# Patient Record
Sex: Female | Born: 1975 | Race: White | Hispanic: No | Marital: Married | Smoking: Never smoker
Health system: Southern US, Community
[De-identification: ages and names within clinical notes are randomized; demographics above are authoritative.]

## PROBLEM LIST (undated history)

## (undated) ENCOUNTER — Ambulatory Visit: Admission: EM | Source: Home / Self Care

## (undated) DIAGNOSIS — IMO0002 Reserved for concepts with insufficient information to code with codable children: Secondary | ICD-10-CM

## (undated) DIAGNOSIS — Z8619 Personal history of other infectious and parasitic diseases: Secondary | ICD-10-CM

## (undated) DIAGNOSIS — O99820 Streptococcus B carrier state complicating pregnancy: Secondary | ICD-10-CM

## (undated) DIAGNOSIS — B379 Candidiasis, unspecified: Secondary | ICD-10-CM

## (undated) HISTORY — PX: WISDOM TOOTH EXTRACTION: SHX21

## (undated) HISTORY — DX: Streptococcus B carrier state complicating pregnancy: O99.820

## (undated) HISTORY — DX: Personal history of other infectious and parasitic diseases: Z86.19

## (undated) HISTORY — DX: Reserved for concepts with insufficient information to code with codable children: IMO0002

## (undated) HISTORY — DX: Candidiasis, unspecified: B37.9

---

## 2010-07-04 DIAGNOSIS — IMO0002 Reserved for concepts with insufficient information to code with codable children: Secondary | ICD-10-CM

## 2010-07-04 DIAGNOSIS — R87619 Unspecified abnormal cytological findings in specimens from cervix uteri: Secondary | ICD-10-CM

## 2010-07-04 HISTORY — DX: Reserved for concepts with insufficient information to code with codable children: IMO0002

## 2010-07-04 HISTORY — DX: Unspecified abnormal cytological findings in specimens from cervix uteri: R87.619

## 2011-08-05 NOTE — L&D Delivery Note (Signed)
Delivery Note  Labor progressed quickly, FHR stable, passive descent and intermittent pushing    At 5:05 AM a viable female was delivered via Vaginal, Spontaneous Delivery (Presentation: Right Occiput Anterior).  Loose nuchal cord, delivered through, easy delivery of shoulders, APGAR: 8, 9; weight 8 lb 3.9 oz (3740 g).   Placenta status: Intact, Spontaneous.  Cord: 3 vessels with the following complications: None.  Cord pH: n/a  Anesthesia: Epidural, local  Episiotomy: None Lacerations: 1st degree Suture Repair: 4-0 monocryl  Est. Blood Loss (mL): 300  Mom to postpartum.  Baby to nursery-stable. Infant remains in room skin-skin Pt plans to BF Plans IP circumcision  Desires micronor for contraception Dr Normand Sloop notified   Malissa Hippo 04/24/2012, 7:22 AM

## 2011-10-09 ENCOUNTER — Encounter (INDEPENDENT_AMBULATORY_CARE_PROVIDER_SITE_OTHER): Payer: 59

## 2011-10-09 DIAGNOSIS — Z3201 Encounter for pregnancy test, result positive: Secondary | ICD-10-CM

## 2011-10-09 LAB — OB RESULTS CONSOLE HIV ANTIBODY (ROUTINE TESTING): HIV: NONREACTIVE

## 2011-10-09 LAB — OB RESULTS CONSOLE HEPATITIS B SURFACE ANTIGEN: Hepatitis B Surface Ag: NEGATIVE

## 2011-10-09 LAB — OB RESULTS CONSOLE RUBELLA ANTIBODY, IGM: Rubella: IMMUNE

## 2011-10-09 LAB — OB RESULTS CONSOLE ANTIBODY SCREEN: Antibody Screen: NEGATIVE

## 2011-10-17 ENCOUNTER — Other Ambulatory Visit: Payer: 59

## 2011-10-17 ENCOUNTER — Other Ambulatory Visit (INDEPENDENT_AMBULATORY_CARE_PROVIDER_SITE_OTHER): Payer: 59

## 2011-10-17 ENCOUNTER — Encounter (INDEPENDENT_AMBULATORY_CARE_PROVIDER_SITE_OTHER): Payer: 59

## 2011-10-17 DIAGNOSIS — Z1389 Encounter for screening for other disorder: Secondary | ICD-10-CM

## 2011-10-17 DIAGNOSIS — Z36 Encounter for antenatal screening of mother: Secondary | ICD-10-CM

## 2011-10-28 ENCOUNTER — Encounter (INDEPENDENT_AMBULATORY_CARE_PROVIDER_SITE_OTHER): Payer: 59 | Admitting: Obstetrics and Gynecology

## 2011-10-28 DIAGNOSIS — Z3480 Encounter for supervision of other normal pregnancy, unspecified trimester: Secondary | ICD-10-CM

## 2011-11-10 ENCOUNTER — Other Ambulatory Visit: Payer: Self-pay | Admitting: Obstetrics and Gynecology

## 2011-11-10 ENCOUNTER — Other Ambulatory Visit (INDEPENDENT_AMBULATORY_CARE_PROVIDER_SITE_OTHER): Payer: 59

## 2011-11-10 DIAGNOSIS — Z331 Pregnant state, incidental: Secondary | ICD-10-CM

## 2011-11-11 ENCOUNTER — Other Ambulatory Visit: Payer: 59

## 2011-11-11 LAB — ALPHA FETOPROTEIN, MATERNAL
Open Spina bifida: NEGATIVE
Osb Risk: <1:27300 {titer}

## 2011-11-24 ENCOUNTER — Other Ambulatory Visit: Payer: Self-pay

## 2011-11-24 DIAGNOSIS — Z331 Pregnant state, incidental: Secondary | ICD-10-CM

## 2011-11-25 ENCOUNTER — Other Ambulatory Visit: Payer: 59

## 2011-11-25 ENCOUNTER — Ambulatory Visit (INDEPENDENT_AMBULATORY_CARE_PROVIDER_SITE_OTHER): Payer: 59

## 2011-11-25 ENCOUNTER — Encounter: Payer: Self-pay | Admitting: Obstetrics and Gynecology

## 2011-11-25 ENCOUNTER — Ambulatory Visit (INDEPENDENT_AMBULATORY_CARE_PROVIDER_SITE_OTHER): Payer: 59 | Admitting: Obstetrics and Gynecology

## 2011-11-25 ENCOUNTER — Other Ambulatory Visit: Payer: Self-pay | Admitting: Obstetrics and Gynecology

## 2011-11-25 VITALS — BP 102/58 | Ht 69.0 in | Wt 176.0 lb

## 2011-11-25 DIAGNOSIS — Z331 Pregnant state, incidental: Secondary | ICD-10-CM

## 2011-11-25 LAB — US OB COMP + 14 WK

## 2011-11-25 NOTE — Patient Instructions (Signed)
Preterm Labor Preterm labor is when labor starts at less than 37 weeks of pregnancy. The normal length of a pregnancy is 39 to 41 weeks. CAUSES Often, there is no identifiable underlying cause as to why a woman goes into preterm labor. However, one of the most common known causes of preterm labor is infection. Infections of the uterus, cervix, vagina, amniotic sac, bladder, kidney, or even the lungs (pneumonia) can cause labor to start. Other causes of preterm labor include:  Urogenital infections, such as yeast infections and bacterial vaginosis.   Uterine abnormalities (uterine shape, uterine septum, fibroids, bleeding from the placenta).   A cervix that has been operated on and opens prematurely.   Malformations in the baby.   Multiple gestations (twins, triplets, and so on).   Breakage of the amniotic sac.  Additional risk factors for preterm labor include:  Previous history of preterm labor.   Premature rupture of membranes (PROM).   A placenta that covers the opening of the cervix (placenta previa).   A placenta that separates from the uterus (placenta abruption).   A cervix that is too weak to hold the baby in the uterus (incompetence cervix).   Having too much fluid in the amniotic sac (polyhydramnios).   Taking illegal drugs or smoking while pregnant.   Not gaining enough weight while pregnant.   Women younger than 18 and older than 35 years old.   Low socioeconomic status.   African-American ethnicity.  SYMPTOMS Signs and symptoms of preterm labor include:  Menstrual-like cramps.   Contractions that are 30 to 70 seconds apart, become very regular, closer together, and are more intense and painful.   Contractions that start on the top of the uterus and spread down to the lower abdomen and back.   A sense of increased pelvic pressure or back pain.   A watery or bloody discharge that comes from the vagina.  DIAGNOSIS  A diagnosis can be confirmed by:  A  vaginal exam.   An ultrasound of the cervix.   Sampling (swabbing) cervico-vaginal secretions. These samples can be tested for the presence of fetal fibronectin. This is a protein found in cervical discharge which is associated with preterm labor.   Fetal monitoring.  TREATMENT  Depending on the length of the pregnancy and other circumstances, a caregiver may suggest bed rest. If necessary, there are medicines that can be given to stop contractions and to quicken fetal lung maturity. If labor happens before 34 weeks of pregnancy, a prolonged hospital stay may be recommended. Treatment depends on the condition of both the mother and baby. PREVENTION There are some things a mother can do to lower the risk of preterm labor in future pregnancies. A woman can:   Stop smoking.   Maintain healthy weight gain and avoid chemicals and drugs that are not necessary.   Be watchful for any type of infection.   Inform her caregiver if she has a known history of preterm labor.  Document Released: 10/11/2003 Document Revised: 07/10/2011 Document Reviewed: 11/15/2010 Exit[employer] Patient Information 2012 [employer].  ABCs of Pregnancy A Antepartum care is very important. Be sure you see your doctor and get prenatal care as soon as you think you are pregnant. At this time, you will be tested for infection, genetic abnormalities and potential problems with you and the pregnancy. This is the time to discuss diet, exercise, work, medications, labor, pain medication during labor and the possibility of a cesarean delivery. Ask any questions that may concern   you. It is important to see your doctor regularly throughout your pregnancy. Avoid exposure to toxic substances and chemicals - such as cleaning solvents, lead and mercury, some insecticides, and paint. Pregnant women should avoid exposure to paint fumes, and fumes that cause you to feel ill, dizzy or faint. When possible, it is a good idea to have a  pre-pregnancy consultation with your caregiver to begin some important recommendations your caregiver suggests such as, taking folic acid, exercising, quitting smoking, avoiding alcoholic beverages, etc. B Breastfeeding is the healthiest choice for both you and your baby. It has many nutritional benefits for the baby and health benefits for the mother. It also creates a very tight and loving bond between the baby and mother. Talk to your doctor, your family and friends, and your employer about how you choose to feed your baby and how they can support you in your decision. Not all birth defects can be prevented, but a woman can take actions that may increase her chance of having a healthy baby. Many birth defects happen very early in pregnancy, sometimes before a woman even knows she is pregnant. Birth defects or abnormalities of any child in your or the father's family should be discussed with your caregiver. Get a good support bra as your breast size changes. Wear it especially when you exercise and when nursing.  C Celebrate the news of your pregnancy with the your spouse/father and family. Childbirth classes are helpful to take for you and the spouse/father because it helps to understand what happens during the pregnancy, labor and delivery. Cesarean delivery should be discussed with your doctor so you are prepared for that possibility. The pros and cons of circumcision if it is a boy, should be discussed with your pediatrician. Cigarette smoking during pregnancy can result in low birth weight babies. It has been associated with infertility, miscarriages, tubal pregnancies, infant death (mortality) and poor health (morbidity) in childhood. Additionally, cigarette smoking may cause long-term learning disabilities. If you smoke, you should try to quit before getting pregnant and not smoke during the pregnancy. Secondary smoke may also harm a mother and her developing baby. It is a good idea to ask people to  stop smoking around you during your pregnancy and after the baby is born. Extra calcium is necessary when you are pregnant and is found in your prenatal vitamin, in dairy products, green leafy vegetables and in calcium supplements. D A healthy diet according to your current weight and height, along with vitamins and mineral supplements should be discussed with your caregiver. Domestic abuse or violence should be made known to your doctor right away to get the situation corrected. Drink more water when you exercise to keep hydrated. Discomfort of your back and legs usually develops and progresses from the middle of the second trimester through to delivery of the baby. This is because of the enlarging baby and uterus, which may also affect your balance. Do not take illegal drugs. Illegal drugs can seriously harm the baby and you. Drink extra fluids (water is best) throughout pregnancy to help your body keep up with the increases in your blood volume. Drink at least 6 to 8 glasses of water, fruit juice, or milk each day. A good way to know you are drinking enough fluid is when your urine looks almost like clear water or is very light yellow.  E Eat healthy to get the nutrients you and your unborn baby need. Your meals should include the five basic food groups.   Exercise (30 minutes of light to moderate exercise a day) is important and encouraged during pregnancy, if there are no medical problems or problems with the pregnancy. Exercise that causes discomfort or dizziness should be stopped and reported to your caregiver. Emotions during pregnancy can change from being ecstatic to depression and should be understood by you, your partner and your family. F Fetal screening with ultrasound, amniocentesis and monitoring during pregnancy and labor is common and sometimes necessary. Take 400 micrograms of folic acid daily both before, when possible, and during the first few months of pregnancy to reduce the risk of birth  defects of the brain and spine. All women who could possibly become pregnant should take a vitamin with folic acid, every day. It is also important to eat a healthy diet with fortified foods (enriched grain products, including cereals, rice, breads, and pastas) and foods with natural sources of folate (orange juice, green leafy vegetables, beans, peanuts, broccoli, asparagus, peas, and lentils). The father should be involved with all aspects of the pregnancy including, the prenatal care, childbirth classes, labor, delivery, and postpartum time. Fathers may also have emotional concerns about being a father, financial needs, and raising a family. G Genetic testing should be done appropriately. It is important to know your family and the father's history. If there have been problems with pregnancies or birth defects in your family, report these to your doctor. Also, genetic counselors can talk with you about the information you might need in making decisions about having a family. You can call a major medical center in your area for help in finding a board-certified genetic counselor. Genetic testing and counseling should be done before pregnancy when possible, especially if there is a history of problems in the mother's or father's family. Certain ethnic backgrounds are more at risk for genetic defects. H Get familiar with the hospital where you will be having your baby. Get to know how long it takes to get there, the labor and delivery area, and the hospital procedures. Be sure your medical insurance is accepted there. Get your home ready for the baby including, clothes, the baby's room (when possible), furniture and car seat. Hand washing is important throughout the day, especially after handling raw meat and poultry, changing the baby's diaper or using the bathroom. This can help prevent the spread of many bacteria and viruses that cause infection. Your hair may become dry and thinner, but will return to normal  a few weeks after the baby is born. Heartburn is a common problem that can be treated by taking antacids recommended by your caregiver, eating smaller meals 5 or 6 times a day, not drinking liquids when eating, drinking between meals and raising the head of your bed 2 to 3 inches. I Insurance to cover you, the baby, doctor and hospital should be reviewed so that you will be prepared to pay any costs not covered by your insurance plan. If you do not have medical insurance, there are usually clinics and services available for you in your community. Take 30 milligrams of iron during your pregnancy as prescribed by your doctor to reduce the risk of low red blood cells (anemia) later in pregnancy. All women of childbearing age should eat a diet rich in iron. J There should be a joint effort for the mother, father and any other children to adapt to the pregnancy financially, emotionally, and psychologically during the pregnancy. Join a support group for moms-to-be. Or, join a class on parenting or childbirth.   Have the family participate when possible. K Know your limits. Let your caregiver know if you experience any of the following:   Pain of any kind.   Strong cramps.   You develop a lot of weight in a short period of time (5 pounds in 3 to 5 days).   Vaginal bleeding, leaking of amniotic fluid.   Headache, vision problems.   Dizziness, fainting, shortness of breath.   Chest pain.   Fever of 102 F (38.9 C) or higher.   Gush of clear fluid from your vagina.   Painful urination.   Domestic violence.   Irregular heartbeat (palpitations).   Rapid beating of the heart (tachycardia).   Constant feeling sick to your stomach (nauseous) and vomiting.   Trouble walking, fluid retention (edema).   Muscle weakness.   If your baby has decreased activity.   Persistent diarrhea.   Abnormal vaginal discharge.   Uterine contractions at 20-minute intervals.   Back pain that travels down  your leg.  L Learn and practice that what you eat and drink should be in moderation and healthy for you and your baby. Legal drugs such as alcohol and caffeine are important issues for pregnant women. There is no safe amount of alcohol a woman can drink while pregnant. Fetal alcohol syndrome, a disorder characterized by growth retardation, facial abnormalities, and central nervous system dysfunction, is caused by a woman's use of alcohol during pregnancy. Caffeine, found in tea, coffee, soft drinks and chocolate, should also be limited. Be sure to read labels when trying to cut down on caffeine during pregnancy. More than 200 foods, beverages, and over-the-counter medications contain caffeine and have a high salt content! There are coffees and teas that do not contain caffeine. M Medical conditions such as diabetes, epilepsy, and high blood pressure should be treated and kept under control before pregnancy when possible, but especially during pregnancy. Ask your caregiver about any medications that may need to be changed or adjusted during pregnancy. If you are currently taking any medications, ask your caregiver if it is safe to take them while you are pregnant or before getting pregnant when possible. Also, be sure to discuss any herbs or vitamins you are taking. They are medicines, too! Discuss with your doctor all medications, prescribed and over-the-counter, that you are taking. During your prenatal visit, discuss the medications your doctor may give you during labor and delivery. N Never be afraid to ask your doctor or caregiver questions about your health, the progress of the pregnancy, family problems, stressful situations, and recommendation for a pediatrician, if you do not have one. It is better to take all precautions and discuss any questions or concerns you may have during your office visits. It is a good idea to write down your questions before you visit the doctor. O Over-the-counter cough  and cold remedies may contain alcohol or other ingredients that should be avoided during pregnancy. Ask your caregiver about prescription, herbs or over-the-counter medications that you are taking or may consider taking while pregnant.  P Physical activity during pregnancy can benefit both you and your baby by lessening discomfort and fatigue, providing a sense of well-being, and increasing the likelihood of early recovery after delivery. Light to moderate exercise during pregnancy strengthens the belly (abdominal) and back muscles. This helps improve posture. Practicing yoga, walking, swimming, and cycling on a stationary bicycle are usually safe exercises for pregnant women. Avoid scuba diving, exercise at high altitudes (over 3000 feet), skiing, horseback riding, contact   sports, etc. Always check with your doctor before beginning any kind of exercise, especially during pregnancy and especially if you did not exercise before getting pregnant. Q Queasiness, stomach upset and morning sickness are common during pregnancy. Eating a couple of crackers or dry toast before getting out of bed. Foods that you normally love may make you feel sick to your stomach. You may need to substitute other nutritious foods. Eating 5 or 6 small meals a day instead of 3 large ones may make you feel better. Do not drink with your meals, drink between meals. Questions that you have should be written down and asked during your prenatal visits. R Read about and make plans to baby-proof your home. There are important tips for making your home a safer environment for your baby. Review the tips and make your home safer for you and your baby. Read food labels regarding calories, salt and fat content in the food. S Saunas, hot tubs, and steam rooms should be avoided while you are pregnant. Excessive high heat may be harmful during your pregnancy. Your caregiver will screen and examine you for sexually transmitted diseases and genetic  disorders during your prenatal visits. Learn the signs of labor. Sexual relations while pregnant is safe unless there is a medical or pregnancy problem and your caregiver advises against it. T Traveling long distances should be avoided especially in the third trimester of your pregnancy. If you do have to travel out of state, be sure to take a copy of your medical records and medical insurance plan with you. You should not travel long distances without seeing your doctor first. Most airlines will not allow you to travel after 36 weeks of pregnancy. Toxoplasmosis is an infection caused by a parasite that can seriously harm an unborn baby. Avoid eating undercooked meat and handling cat litter. Be sure to wear gloves when gardening. Tingling of the hands and fingers is not unusual and is due to fluid retention. This will go away after the baby is born. U Womb (uterus) size increases during the first trimester. Your kidneys will begin to function more efficiently. This may cause you to feel the need to urinate more often. You may also leak urine when sneezing, coughing or laughing. This is due to the growing uterus pressing against your bladder, which lies directly in front of and slightly under the uterus during the first few months of pregnancy. If you experience burning along with frequency of urination or bloody urine, be sure to tell your doctor. The size of your uterus in the third trimester may cause a problem with your balance. It is advisable to maintain good posture and avoid wearing high heels during this time. An ultrasound of your baby may be necessary during your pregnancy and is safe for you and your baby. V Vaccinations are an important concern for pregnant women. Get needed vaccines before pregnancy. Center for Disease Control (www.cdc.gov) has clear guidelines for the use of vaccines during pregnancy. Review the list, be sure to discuss it with your doctor. Prenatal vitamins are helpful and  healthy for you and the baby. Do not take extra vitamins except what is recommended. Taking too much of certain vitamins can cause overdose problems. Continuous vomiting should be reported to your caregiver. Varicose veins may appear especially if there is a family history of varicose veins. They should subside after the delivery of the baby. Support hose helps if there is leg discomfort. W Being overweight or underweight during pregnancy may   cause problems. Try to get within 15 pounds of your ideal weight before pregnancy. Remember, pregnancy is not a time to be dieting! Do not stop eating or start skipping meals as your weight increases. Both you and your baby need the calories and nutrition you receive from a healthy diet. Be sure to consult with your doctor about your diet. There is a formula and diet plan available depending on whether you are overweight or underweight. Your caregiver or nutritionist can help and advise you if necessary. X Avoid X-rays. If you must have dental work or diagnostic tests, tell your dentist or physician that you are pregnant so that extra care can be taken. X-rays should only be taken when the risks of not taking them outweigh the risk of taking them. If needed, only the minimum amount of radiation should be used. When X-rays are necessary, protective lead shields should be used to cover areas of the body that are not being X-rayed. Y Your baby loves you. Breastfeeding your baby creates a loving and very close bond between the two of you. Give your baby a healthy environment to live in while you are pregnant. Infants and children require constant care and guidance. Their health and safety should be carefully watched at all times. After the baby is born, rest or take a nap when the baby is sleeping. Z Get your ZZZs. Be sure to get plenty of rest. Resting on your side as often as possible, especially on your left side is advised. It provides the best circulation to your baby  and helps reduce swelling. Try taking a nap for 30 to 45 minutes in the afternoon when possible. After the baby is born rest or take a nap when the baby is sleeping. Try elevating your feet for that amount of time when possible. It helps the circulation in your legs and helps reduce swelling.  Most information courtesy of the CDC. Document Released: 07/21/2005 Document Revised: 07/10/2011 Document Reviewed: 04/04/2009 [employer] Patient Information 2012 [employer]. 

## 2011-11-25 NOTE — Progress Notes (Signed)
Jonita Albee [redacted]w[redacted]d   Doing well No ctx, VB or LOF AFP normal Anat [place] today EDC=9/13  LMP EDC=9/22 - sure Previous scan for 1st trim screen - EDC=9/16 EFW=11oz (>97%) Cx=3.44cm Anterior grade 0 placenta Normal fluid All anatomy seen, no abnoralities   Plan:

## 2011-12-23 ENCOUNTER — Ambulatory Visit (INDEPENDENT_AMBULATORY_CARE_PROVIDER_SITE_OTHER): Payer: 59 | Admitting: Obstetrics and Gynecology

## 2011-12-23 ENCOUNTER — Encounter: Payer: Self-pay | Admitting: Obstetrics and Gynecology

## 2011-12-23 VITALS — BP 90/50 | Wt 178.0 lb

## 2011-12-23 DIAGNOSIS — Z3490 Encounter for supervision of normal pregnancy, unspecified, unspecified trimester: Secondary | ICD-10-CM

## 2011-12-23 DIAGNOSIS — Z3480 Encounter for supervision of other normal pregnancy, unspecified trimester: Secondary | ICD-10-CM

## 2011-12-23 DIAGNOSIS — R21 Rash and other nonspecific skin eruption: Secondary | ICD-10-CM

## 2011-12-23 MED ORDER — ONDANSETRON HCL 4 MG PO TABS: 4.0000 mg | ORAL_TABLET | Freq: Three times a day (TID) | ORAL | Status: AC | PRN

## 2011-12-23 NOTE — Progress Notes (Signed)
Doing well, except for sporadic rash on abdomen and arms--red bumps, itchy.  Declines meds, may want derm referral if persists. Had virus this weekend, still very nauseated, but vomiting and diarrhea resolved. Discussed EDC:  Normal LMP dating 04/25/12.                               1st trimester [place] [facility] 04/19/12.                                Anatomy scan [place] [facility] 04/16/12 Patient very concerned about CNM at last visit commenting about possible change in EDC--doesn't want that to increase her risk of induction. Advised patient I would consult with MD regarding dating criteria for final word. I would anticipate leaving EDC 04/25/12, since 1st trimester [place] was only 6 days different from normal LMP EDC and hx of 28 day cycles. Rx Zofran 4 mg ODT to pharmacy.  If rash persists, will draw bile acids at [place]. Derm referral if patient wishes to pursue--I did give her names for dermatologists and reviewed peds in community at her request.  Glucola, hgb, RPR, and NV--bile acids/CMP if rash persists.

## 2011-12-23 NOTE — Progress Notes (Signed)
C/o of hyperemesis Pt wants rx for Zofran Wants to discuss dating for EDD

## 2012-01-20 ENCOUNTER — Ambulatory Visit (INDEPENDENT_AMBULATORY_CARE_PROVIDER_SITE_OTHER): Payer: 59 | Admitting: Obstetrics and Gynecology

## 2012-01-20 ENCOUNTER — Encounter: Payer: 59 | Admitting: Obstetrics and Gynecology

## 2012-01-20 ENCOUNTER — Other Ambulatory Visit: Payer: 59

## 2012-01-20 ENCOUNTER — Ambulatory Visit: Payer: 59

## 2012-01-20 ENCOUNTER — Encounter: Payer: Self-pay | Admitting: Obstetrics and Gynecology

## 2012-01-20 VITALS — BP 100/64 | Wt 183.0 lb

## 2012-01-20 DIAGNOSIS — Z3490 Encounter for supervision of normal pregnancy, unspecified, unspecified trimester: Secondary | ICD-10-CM

## 2012-01-20 DIAGNOSIS — Z331 Pregnant state, incidental: Secondary | ICD-10-CM

## 2012-01-20 DIAGNOSIS — Z3689 Encounter for other specified antenatal screening: Secondary | ICD-10-CM

## 2012-01-20 MED ORDER — PRENATAL MULTIVITAMIN CH: 1.0000 | ORAL_TABLET | Freq: Every day | ORAL | Status: DC

## 2012-01-20 NOTE — Progress Notes (Signed)
Patient ID: Alicia Barber, female   DOB: [birth year], 36 y.o.   MRN: [id number] A pos, 1 gtt, rpr, hgb today, Reviewed s/s preterm labor, srom, vag bleeding, kick counts to report, enc 8 water daily and frequent voids Lavera Guise, CNM

## 2012-01-20 NOTE — Addendum Note (Signed)
Addended by: Janeece Agee on: 01/20/2012 01:29 PM   Modules accepted: Orders

## 2012-01-20 NOTE — Progress Notes (Signed)
1 gtt given today without difficulty  

## 2012-01-21 LAB — GLUCOSE TOLERANCE, 1 HOUR: Glucose, 1 Hour GTT: 104 mg/dL (ref 70–140)

## 2012-02-10 ENCOUNTER — Ambulatory Visit (INDEPENDENT_AMBULATORY_CARE_PROVIDER_SITE_OTHER): Payer: 59 | Admitting: Obstetrics and Gynecology

## 2012-02-10 VITALS — BP 100/62 | Wt 188.0 lb

## 2012-02-10 DIAGNOSIS — Z331 Pregnant state, incidental: Secondary | ICD-10-CM

## 2012-02-10 NOTE — Progress Notes (Signed)
Glucola normal, Hgb 11.3, RPR negative

## 2012-02-10 NOTE — Progress Notes (Signed)
Pt. Stated no issues today.  

## 2012-02-25 ENCOUNTER — Encounter: Payer: Self-pay | Admitting: Obstetrics and Gynecology

## 2012-02-25 ENCOUNTER — Ambulatory Visit (INDEPENDENT_AMBULATORY_CARE_PROVIDER_SITE_OTHER): Payer: 59 | Admitting: Obstetrics and Gynecology

## 2012-02-25 VITALS — BP 90/62 | Wt 191.0 lb

## 2012-02-25 DIAGNOSIS — Z331 Pregnant state, incidental: Secondary | ICD-10-CM

## 2012-02-25 DIAGNOSIS — Z3490 Encounter for supervision of normal pregnancy, unspecified, unspecified trimester: Secondary | ICD-10-CM

## 2012-02-25 NOTE — Progress Notes (Signed)
C/o intermittent "lighting strike" shooting pain in her cervix stopping her in her tracks. Starting to occur more frequent now.

## 2012-02-25 NOTE — Progress Notes (Signed)
[redacted]w[redacted]d C/o shoting pain in vagina. SVE: long thick and closed Wet Prep; neg Ph 4.5 Reassured that all is normal. ROB x 2 weeks

## 2012-03-10 ENCOUNTER — Encounter: Payer: Self-pay | Admitting: Obstetrics and Gynecology

## 2012-03-10 ENCOUNTER — Ambulatory Visit (INDEPENDENT_AMBULATORY_CARE_PROVIDER_SITE_OTHER): Payer: 59 | Admitting: Obstetrics and Gynecology

## 2012-03-10 VITALS — BP 100/60 | Wt 194.0 lb

## 2012-03-10 DIAGNOSIS — Z331 Pregnant state, incidental: Secondary | ICD-10-CM

## 2012-03-10 NOTE — Progress Notes (Signed)
No complaints

## 2012-03-10 NOTE — Progress Notes (Signed)
Patient ID: Alicia Barber, female   DOB: Jun 25, 1976, 36 y.o.   MRN: [id number] GBS next visit. Rash to abd arms, neck improved not using anything for it. Reviewed s/s preterm labor, srom, vag bleeding,daily kick counts to report, encouraged 8 water daily and frequent voids. Lavera Guise, CNM

## 2012-03-25 ENCOUNTER — Encounter: Payer: 59 | Admitting: Obstetrics and Gynecology

## 2012-03-31 ENCOUNTER — Ambulatory Visit (INDEPENDENT_AMBULATORY_CARE_PROVIDER_SITE_OTHER): Payer: 59

## 2012-03-31 VITALS — BP 108/62 | Wt 197.0 lb

## 2012-03-31 DIAGNOSIS — Z331 Pregnant state, incidental: Secondary | ICD-10-CM

## 2012-03-31 NOTE — Progress Notes (Signed)
[redacted]w[redacted]d; No c/o's. GBS sent.  Rev'd labor s/s & FKC. Continue w/ weekly ROBs.

## 2012-03-31 NOTE — Progress Notes (Signed)
Pt states no concerns today.   

## 2012-04-03 LAB — CULTURE, BETA STREP (GROUP B ONLY)

## 2012-04-09 ENCOUNTER — Ambulatory Visit (INDEPENDENT_AMBULATORY_CARE_PROVIDER_SITE_OTHER): Payer: 59 | Admitting: Obstetrics and Gynecology

## 2012-04-09 VITALS — BP 110/64 | Wt 198.0 lb

## 2012-04-09 DIAGNOSIS — Z331 Pregnant state, incidental: Secondary | ICD-10-CM

## 2012-04-09 NOTE — Progress Notes (Signed)
[redacted]w[redacted]d GBS negative GFM

## 2012-04-15 ENCOUNTER — Encounter: Payer: Self-pay | Admitting: Obstetrics and Gynecology

## 2012-04-15 ENCOUNTER — Ambulatory Visit (INDEPENDENT_AMBULATORY_CARE_PROVIDER_SITE_OTHER): Payer: 59 | Admitting: Obstetrics and Gynecology

## 2012-04-15 VITALS — BP 110/62 | Wt 198.0 lb

## 2012-04-15 DIAGNOSIS — Z3490 Encounter for supervision of normal pregnancy, unspecified, unspecified trimester: Secondary | ICD-10-CM

## 2012-04-15 DIAGNOSIS — Z331 Pregnant state, incidental: Secondary | ICD-10-CM

## 2012-04-15 NOTE — Progress Notes (Signed)
[redacted]w[redacted]d  Request cervix check.

## 2012-04-15 NOTE — Progress Notes (Signed)
Doing well, but ready. Cervix 2 cm, 50%, soft, -2.

## 2012-04-19 ENCOUNTER — Encounter: Payer: 59 | Admitting: Obstetrics and Gynecology

## 2012-04-20 ENCOUNTER — Encounter: Payer: 59 | Admitting: Obstetrics and Gynecology

## 2012-04-21 ENCOUNTER — Encounter: Payer: Self-pay | Admitting: Obstetrics and Gynecology

## 2012-04-21 ENCOUNTER — Telehealth: Payer: Self-pay | Admitting: Obstetrics and Gynecology

## 2012-04-21 ENCOUNTER — Ambulatory Visit (INDEPENDENT_AMBULATORY_CARE_PROVIDER_SITE_OTHER): Payer: 59 | Admitting: Obstetrics and Gynecology

## 2012-04-21 VITALS — BP 110/60 | Wt 198.0 lb

## 2012-04-21 DIAGNOSIS — Z331 Pregnant state, incidental: Secondary | ICD-10-CM

## 2012-04-21 NOTE — Patient Instructions (Signed)
Fetal Movement Counts Patient Name: __________________________________________________ Patient Due Date: ____________________ Kick counts is highly recommended in high risk pregnancies, but it is a good idea for every pregnant woman to do. Start counting fetal movements at 28 weeks of the pregnancy. Fetal movements increase after eating a full meal or eating or drinking something sweet (the blood sugar is higher). It is also important to drink plenty of fluids (well hydrated) before doing the count. Lie on your left side because it helps with the circulation or you can sit in a comfortable chair with your arms over your belly (abdomen) with no distractions around you. DOING THE COUNT  Try to do the count the same time of day each time you do it.   Mark the day and time, then see how long it takes for you to feel 10 movements (kicks, flutters, swishes, rolls). You should have at least 10 movements within 2 hours. You will most likely feel 10 movements in much less than 2 hours. If you do not, wait an hour and count again. After a couple of days you will see a pattern.   What you are looking for is a change in the pattern or not enough counts in 2 hours. Is it taking longer in time to reach 10 movements?  SEEK MEDICAL CARE IF:  You feel less than 10 counts in 2 hours. Tried twice.   No movement in one hour.   The pattern is changing or taking longer each day to reach 10 counts in 2 hours.   You feel the baby is not moving as it usually does.  Date: ____________ Movements: ____________ Start time: ____________ Finish time: ____________  Date: ____________ Movements: ____________ Start time: ____________ Finish time: ____________ Date: ____________ Movements: ____________ Start time: ____________ Finish time: ____________ Date: ____________ Movements: ____________ Start time: ____________ Finish time: ____________ Date: ____________ Movements: ____________ Start time: ____________ Finish time:  ____________ Date: ____________ Movements: ____________ Start time: ____________ Finish time: ____________ Date: ____________ Movements: ____________ Start time: ____________ Finish time: ____________ Date: ____________ Movements: ____________ Start time: ____________ Finish time: ____________  Date: ____________ Movements: ____________ Start time: ____________ Finish time: ____________ Date: ____________ Movements: ____________ Start time: ____________ Finish time: ____________ Date: ____________ Movements: ____________ Start time: ____________ Finish time: ____________ Date: ____________ Movements: ____________ Start time: ____________ Finish time: ____________ Date: ____________ Movements: ____________ Start time: ____________ Finish time: ____________ Date: ____________ Movements: ____________ Start time: ____________ Finish time: ____________ Date: ____________ Movements: ____________ Start time: ____________ Finish time: ____________  Date: ____________ Movements: ____________ Start time: ____________ Finish time: ____________ Date: ____________ Movements: ____________ Start time: ____________ Finish time: ____________ Date: ____________ Movements: ____________ Start time: ____________ Finish time: ____________ Date: ____________ Movements: ____________ Start time: ____________ Finish time: ____________ Date: ____________ Movements: ____________ Start time: ____________ Finish time: ____________ Date: ____________ Movements: ____________ Start time: ____________ Finish time: ____________ Date: ____________ Movements: ____________ Start time: ____________ Finish time: ____________  Date: ____________ Movements: ____________ Start time: ____________ Finish time: ____________ Date: ____________ Movements: ____________ Start time: ____________ Finish time: ____________ Date: ____________ Movements: ____________ Start time: ____________ Finish time: ____________ Date: ____________ Movements:  ____________ Start time: ____________ Finish time: ____________ Date: ____________ Movements: ____________ Start time: ____________ Finish time: ____________ Date: ____________ Movements: ____________ Start time: ____________ Finish time: ____________ Date: ____________ Movements: ____________ Start time: ____________ Finish time: ____________  Date: ____________ Movements: ____________ Start time: ____________ Finish time: ____________ Date: ____________ Movements: ____________ Start time: ____________ Finish time: ____________ Date: ____________ Movements: ____________ Start time:   ____________ Finish time: ____________ Date: ____________ Movements: ____________ Start time: ____________ Finish time: ____________ Date: ____________ Movements: ____________ Start time: ____________ Finish time: ____________ Date: ____________ Movements: ____________ Start time: ____________ Finish time: ____________ Date: ____________ Movements: ____________ Start time: ____________ Finish time: ____________  Date: ____________ Movements: ____________ Start time: ____________ Finish time: ____________ Date: ____________ Movements: ____________ Start time: ____________ Finish time: ____________ Date: ____________ Movements: ____________ Start time: ____________ Finish time: ____________ Date: ____________ Movements: ____________ Start time: ____________ Finish time: ____________ Date: ____________ Movements: ____________ Start time: ____________ Finish time: ____________ Date: ____________ Movements: ____________ Start time: ____________ Finish time: ____________ Date: ____________ Movements: ____________ Start time: ____________ Finish time: ____________  Date: ____________ Movements: ____________ Start time: ____________ Finish time: ____________ Date: ____________ Movements: ____________ Start time: ____________ Finish time: ____________ Date: ____________ Movements: ____________ Start time: ____________ Finish  time: ____________ Date: ____________ Movements: ____________ Start time: ____________ Finish time: ____________ Date: ____________ Movements: ____________ Start time: ____________ Finish time: ____________ Date: ____________ Movements: ____________ Start time: ____________ Finish time: ____________ Date: ____________ Movements: ____________ Start time: ____________ Finish time: ____________  Date: ____________ Movements: ____________ Start time: ____________ Finish time: ____________ Date: ____________ Movements: ____________ Start time: ____________ Finish time: ____________ Date: ____________ Movements: ____________ Start time: ____________ Finish time: ____________ Date: ____________ Movements: ____________ Start time: ____________ Finish time: ____________ Date: ____________ Movements: ____________ Start time: ____________ Finish time: ____________ Date: ____________ Movements: ____________ Start time: ____________ Finish time: ____________ Document Released: 08/20/2006 Document Revised: 07/10/2011 Document Reviewed: 02/20/2009 [employer] Patient Information 2012 [employer]. 

## 2012-04-21 NOTE — Progress Notes (Signed)
A/P GBS negative Fetal kick counts reviewed Labor reviewed with pt All patients  questions answered 

## 2012-04-21 NOTE — Progress Notes (Signed)
Pt c/o cramping, would like membranes stripped today.

## 2012-04-21 NOTE — Telephone Encounter (Signed)
Induction scheduled for 05/02/12 @ 7:30am with VL/VH. -Adrianne Pridgen

## 2012-04-22 ENCOUNTER — Telehealth (HOSPITAL_COMMUNITY): Payer: Self-pay | Admitting: *Deleted

## 2012-04-22 ENCOUNTER — Encounter (HOSPITAL_COMMUNITY): Payer: Self-pay | Admitting: *Deleted

## 2012-04-22 NOTE — Telephone Encounter (Signed)
Preadmission screen  

## 2012-04-24 ENCOUNTER — Encounter (HOSPITAL_COMMUNITY): Payer: Self-pay | Admitting: Obstetrics

## 2012-04-24 ENCOUNTER — Inpatient Hospital Stay (HOSPITAL_COMMUNITY): Payer: 59 | Admitting: Anesthesiology

## 2012-04-24 ENCOUNTER — Inpatient Hospital Stay (HOSPITAL_COMMUNITY)
Admission: AD | Admit: 2012-04-24 | Discharge: 2012-04-25 | DRG: 775 | Disposition: A | Discharge status: Home / Self Care | Payer: 59 | Source: Ambulatory Visit | Attending: Obstetrics and Gynecology | Admitting: Obstetrics and Gynecology

## 2012-04-24 ENCOUNTER — Encounter (HOSPITAL_COMMUNITY): Payer: Self-pay | Admitting: Anesthesiology

## 2012-04-24 DIAGNOSIS — O99892 Other specified diseases and conditions complicating childbirth: Secondary | ICD-10-CM | POA: Diagnosis present

## 2012-04-24 DIAGNOSIS — Z3490 Encounter for supervision of normal pregnancy, unspecified, unspecified trimester: Secondary | ICD-10-CM

## 2012-04-24 DIAGNOSIS — I959 Hypotension, unspecified: Secondary | ICD-10-CM | POA: Diagnosis present

## 2012-04-24 DIAGNOSIS — R21 Rash and other nonspecific skin eruption: Secondary | ICD-10-CM

## 2012-04-24 LAB — CBC
HCT: 36 % (ref 36.0–46.0)
Hemoglobin: 11.9 g/dL — ABNORMAL LOW (ref 12.0–15.0)
MCH: 30 pg (ref 26.0–34.0)
MCHC: 33.1 g/dL (ref 30.0–36.0)
MCV: 90.7 fL (ref 78.0–100.0)

## 2012-04-24 LAB — RPR: RPR Ser Ql: NONREACTIVE

## 2012-04-24 MED ORDER — SENNOSIDES-DOCUSATE SODIUM 8.6-50 MG PO TABS
2.0000 | ORAL_TABLET | Freq: Every day | ORAL | Status: DC
  Administered 2012-04-24: 2 via ORAL

## 2012-04-24 MED ORDER — INFLUENZA VIRUS VACC SPLIT PF IM SUSP
0.5000 mL | INTRAMUSCULAR | Status: AC
  Administered 2012-04-25: 0.5 mL via INTRAMUSCULAR
  Filled 2012-04-24: qty 0.5

## 2012-04-24 MED ORDER — ONDANSETRON HCL 4 MG/2ML IJ SOLN: 4.0000 mg | INTRAMUSCULAR | Status: DC | PRN

## 2012-04-24 MED ORDER — PHENYLEPHRINE 40 MCG/ML (10ML) SYRINGE FOR IV PUSH (FOR BLOOD PRESSURE SUPPORT)
80.0000 ug | PREFILLED_SYRINGE | INTRAVENOUS | Status: DC | PRN
  Administered 2012-04-24: 200 ug via INTRAVENOUS

## 2012-04-24 MED ORDER — LACTATED RINGERS IV SOLN
500.0000 mL | INTRAVENOUS | Status: DC | PRN
  Administered 2012-04-24: 500 mL via INTRAVENOUS

## 2012-04-24 MED ORDER — CITRIC ACID-SODIUM CITRATE 334-500 MG/5ML PO SOLN: 30.0000 mL | ORAL | Status: DC | PRN

## 2012-04-24 MED ORDER — ACETAMINOPHEN 325 MG PO TABS: 650.0000 mg | ORAL_TABLET | ORAL | Status: DC | PRN

## 2012-04-24 MED ORDER — WITCH HAZEL-GLYCERIN EX PADS: 1.0000 "application " | MEDICATED_PAD | CUTANEOUS | Status: DC | PRN

## 2012-04-24 MED ORDER — PRENATAL MULTIVITAMIN CH
1.0000 | ORAL_TABLET | Freq: Every day | ORAL | Status: DC
  Administered 2012-04-24 – 2012-04-25 (×2): 1 via ORAL
  Filled 2012-04-24 (×2): qty 1

## 2012-04-24 MED ORDER — LANOLIN HYDROUS EX OINT: TOPICAL_OINTMENT | CUTANEOUS | Status: DC | PRN

## 2012-04-24 MED ORDER — DIPHENHYDRAMINE HCL 50 MG/ML IJ SOLN: 12.5000 mg | INTRAMUSCULAR | Status: DC | PRN

## 2012-04-24 MED ORDER — OXYCODONE-ACETAMINOPHEN 5-325 MG PO TABS: 1.0000 | ORAL_TABLET | ORAL | Status: DC | PRN

## 2012-04-24 MED ORDER — ONDANSETRON HCL 4 MG PO TABS: 4.0000 mg | ORAL_TABLET | ORAL | Status: DC | PRN

## 2012-04-24 MED ORDER — OXYTOCIN 40 UNITS IN LACTATED RINGERS INFUSION - SIMPLE MED
62.5000 mL/h | Freq: Once | INTRAVENOUS | Status: AC
  Administered 2012-04-24: 62.5 mL/h via INTRAVENOUS
  Filled 2012-04-24: qty 1000

## 2012-04-24 MED ORDER — DIPHENHYDRAMINE HCL 25 MG PO CAPS: 25.0000 mg | ORAL_CAPSULE | Freq: Four times a day (QID) | ORAL | Status: DC | PRN

## 2012-04-24 MED ORDER — LACTATED RINGERS IV SOLN
INTRAVENOUS | Status: DC
  Administered 2012-04-24: 03:00:00 via INTRAVENOUS

## 2012-04-24 MED ORDER — FENTANYL 2.5 MCG/ML BUPIVACAINE 1/10 % EPIDURAL INFUSION (WH - ANES)
14.0000 mL/h | INTRAMUSCULAR | Status: DC
  Administered 2012-04-24 (×2): 14 mL/h via EPIDURAL
  Filled 2012-04-24: qty 60

## 2012-04-24 MED ORDER — EPHEDRINE 5 MG/ML INJ: 10.0000 mg | INTRAVENOUS | Status: DC | PRN

## 2012-04-24 MED ORDER — ONDANSETRON HCL 4 MG/2ML IJ SOLN
4.0000 mg | Freq: Four times a day (QID) | INTRAMUSCULAR | Status: DC | PRN
  Filled 2012-04-24: qty 2

## 2012-04-24 MED ORDER — PANTOPRAZOLE SODIUM 40 MG IV SOLR
40.0000 mg | Freq: Once | INTRAVENOUS | Status: AC
  Administered 2012-04-24: 40 mg via INTRAVENOUS
  Filled 2012-04-24: qty 40

## 2012-04-24 MED ORDER — IBUPROFEN 600 MG PO TABS: 600.0000 mg | ORAL_TABLET | Freq: Four times a day (QID) | ORAL | Status: DC | PRN

## 2012-04-24 MED ORDER — DIBUCAINE 1 % RE OINT: 1.0000 "application " | TOPICAL_OINTMENT | RECTAL | Status: DC | PRN

## 2012-04-24 MED ORDER — ZOLPIDEM TARTRATE 5 MG PO TABS: 5.0000 mg | ORAL_TABLET | Freq: Every evening | ORAL | Status: DC | PRN

## 2012-04-24 MED ORDER — TETANUS-DIPHTH-ACELL PERTUSSIS 5-2.5-18.5 LF-MCG/0.5 IM SUSP: 0.5000 mL | Freq: Once | INTRAMUSCULAR | Status: DC

## 2012-04-24 MED ORDER — EPHEDRINE 5 MG/ML INJ
10.0000 mg | INTRAVENOUS | Status: DC | PRN
  Administered 2012-04-24: 10 mg via INTRAVENOUS
  Filled 2012-04-24: qty 4

## 2012-04-24 MED ORDER — IBUPROFEN 600 MG PO TABS
600.0000 mg | ORAL_TABLET | Freq: Four times a day (QID) | ORAL | Status: DC
  Administered 2012-04-24 – 2012-04-25 (×4): 600 mg via ORAL
  Filled 2012-04-24 (×5): qty 1

## 2012-04-24 MED ORDER — BENZOCAINE-MENTHOL 20-0.5 % EX AERO: 1.0000 "application " | INHALATION_SPRAY | CUTANEOUS | Status: DC | PRN

## 2012-04-24 MED ORDER — OXYTOCIN BOLUS FROM INFUSION
500.0000 mL | Freq: Once | INTRAVENOUS | Status: AC
  Administered 2012-04-24: 500 mL via INTRAVENOUS
  Filled 2012-04-24: qty 500

## 2012-04-24 MED ORDER — LIDOCAINE HCL (PF) 1 % IJ SOLN
30.0000 mL | INTRAMUSCULAR | Status: DC | PRN
  Administered 2012-04-24: 30 mL via SUBCUTANEOUS
  Filled 2012-04-24: qty 30

## 2012-04-24 MED ORDER — SIMETHICONE 80 MG PO CHEW: 80.0000 mg | CHEWABLE_TABLET | ORAL | Status: DC | PRN

## 2012-04-24 MED ORDER — FENTANYL CITRATE 0.05 MG/ML IJ SOLN: 100.0000 ug | INTRAMUSCULAR | Status: DC | PRN

## 2012-04-24 MED ORDER — OXYTOCIN 10 UNIT/ML IJ SOLN: 10.0000 [IU] | Freq: Once | INTRAMUSCULAR | Status: DC

## 2012-04-24 MED ORDER — MEASLES, MUMPS & RUBELLA VAC ~~LOC~~ INJ: 0.5000 mL | INJECTION | Freq: Once | SUBCUTANEOUS | Status: DC

## 2012-04-24 MED ORDER — PHENYLEPHRINE 40 MCG/ML (10ML) SYRINGE FOR IV PUSH (FOR BLOOD PRESSURE SUPPORT)
80.0000 ug | PREFILLED_SYRINGE | INTRAVENOUS | Status: DC | PRN
  Filled 2012-04-24: qty 5

## 2012-04-24 MED ORDER — LIDOCAINE HCL (PF) 1 % IJ SOLN
INTRAMUSCULAR | Status: DC | PRN
  Administered 2012-04-24 (×2): 5 mL

## 2012-04-24 MED ORDER — LACTATED RINGERS IV SOLN
500.0000 mL | Freq: Once | INTRAVENOUS | Status: AC
  Administered 2012-04-24: 500 mL via INTRAVENOUS

## 2012-04-24 NOTE — Progress Notes (Signed)
Patient ID: Alicia Barber, female   DOB: [birth year], 36 y.o.   MRN: [id number] .Subjective: Pt comfortable w epidural , feels some pressure w ctx    Objective: BP 114/56  Pulse 66  Temp 97.5 F (36.4 C) (Oral)  Resp 18  Ht 5\' 9"  (1.753 m)  Wt 198 lb 8 oz (90.039 kg)  BMI 29.31 kg/m2  SpO2 97%  LMP 07/20/2011  Breastfeeding? Unknown   FHT:  FHR: 130 bpm, variability: moderate,  accelerations:  Present,  decelerations:  Present prolonged decels to 60's x5 min with maternal hypotension, returned to 90's for 5 min then returned to BL of 130's after BP improved UC:   regular, every 2-3 minutes SVE:   8-9/100/0 AROM for clear fluid    Assessment / Plan: Spontaneous labor, progressing normally Rapid labor progress Maternal hypotension, improved w IVF bolus and ephedrine, 02 on, position changes,  Will allow for passive descent  FHR stablilized   Fetal Wellbeing:  Category II Pain Control:  Epidural  Update physician PRN  Malissa Hippo 04/24/2012, 7:17 AM

## 2012-04-24 NOTE — Anesthesia Postprocedure Evaluation (Signed)
  Anesthesia Post-op Note  Patient: Alicia Barber  Procedure(s) Performed: * No procedures listed *  Patient Location: PACU and Mother/Baby  Anesthesia Type: Epidural  Level of Consciousness: awake, alert , oriented and patient cooperative  Airway and Oxygen Therapy: Patient Spontanous Breathing  Post-op Pain: none  Post-op Assessment: Post-op Vital signs reviewed and Patient's Cardiovascular Status Stable  Post-op Vital Signs: Reviewed and stable  Complications: No apparent anesthesia complications

## 2012-04-24 NOTE — MAU Note (Signed)
Onset of contractions at work tonight, mucus brown tinged discharge.

## 2012-04-24 NOTE — Anesthesia Preprocedure Evaluation (Signed)

## 2012-04-24 NOTE — H&P (Signed)
Alicia Barber is a 36 y.o. female presenting for onset of regular ctx since 2330, stronger now, some bloody show, no LOF, GFM.   HPI: Pt began PNC at [facility] at 11wks. She had a normal 1st trimester screen and AFP, and a normal anatomy  Scan. 1hr gtt was normal. She had an uncomplicated prenatal course.   .Maternal Medical History:  Reason for admission: Reason for admission: contractions.  Contractions: Onset was 3-5 hours ago.   Frequency: regular.   Duration is approximately 60 seconds.   Perceived severity is strong.    Fetal activity: Perceived fetal activity is normal.   Last perceived fetal movement was within the past hour.    Prenatal complications: no prenatal complications   OB History    Grav Para Term Preterm Abortions TAB SAB Ect Mult Living   4 2 2  2  1   2      Past Medical History  Diagnosis Date  . H/O candidiasis   . GBS (group B Streptococcus carrier), +RV culture, currently pregnant   . Yeast infection   . Abnormal Pap smear 07/2010    repeat was negative   Past Surgical History  Procedure Date  . Wisdom tooth extraction    Family History: family history includes Other in her other. Social History:  reports that she has never smoked. She has never used smokeless tobacco. She reports that she does not drink alcohol or use illicit drugs.   Prenatal Transfer Tool  Maternal Diabetes: No Genetic Screening: Normal Maternal Ultrasounds/Referrals: Normal Fetal Ultrasounds or other Referrals:  None Maternal Substance Abuse:  No Significant Maternal Medications:  None Significant Maternal Lab Results:  None Other Comments:  None  Review of Systems  All other systems reviewed and are negative.    Dilation: 10 Effacement (%): 100 Station: -1 Exam by:: S Jorma Tassinari CNM Blood pressure 116/52, pulse 74, temperature 97.5 F (36.4 C), temperature source Oral, resp. rate 18, height 5\' 9"  (1.753 m), weight 198 lb 8 oz (90.039 kg), last menstrual period  07/20/2011, SpO2 97.00%, unknown if currently breastfeeding. Maternal Exam:  Uterine Assessment: Contraction strength is firm.  Contraction duration is 50 seconds. Contraction frequency is regular.   Abdomen: Patient reports no abdominal tenderness. Fundal height is aga.   Estimated fetal weight is 8.   Fetal presentation: vertex  Introitus: Normal vulva. Vagina is positive for vaginal discharge.  Pelvis: adequate for delivery.   Cervix: Cervix evaluated by digital exam.     Fetal Exam Fetal Monitor Review: Mode: hand-held doppler probe.   Baseline rate: 130.  Variability: moderate (6-25 bpm).   Pattern: accelerations present and no decelerations.    Fetal State Assessment: Category I - tracings are normal.     Physical Exam  Nursing note and vitals reviewed. Constitutional: She is oriented to person, place, and time. She appears well-developed and well-nourished. She appears distressed.       Breathing through ctx  HENT:  Head: Normocephalic.  Eyes: Pupils are equal, round, and reactive to light.  Neck: Normal range of motion.  Cardiovascular: Normal rate, regular rhythm and normal heart sounds.   Respiratory: Effort normal and breath sounds normal.  GI: Soft. Bowel sounds are normal.  Genitourinary: Vaginal discharge found.  Musculoskeletal: Normal range of motion. She exhibits no edema.  Neurological: She is alert and oriented to person, place, and time. She has normal reflexes.  Skin: Skin is warm and dry.  Psychiatric: She has a normal mood and affect. Her behavior  is normal.    Prenatal labs: ABO, Rh: A/Positive/-- (03/07 0000) Antibody: Negative (03/07 0000) Rubella: Immune (03/07 0000) RPR: NON REAC (06/18 1307)  HBsAg: Negative (03/07 0000)  HIV: Non-reactive (03/07 0000)  GBS: Negative (08/28 0000)   Assessment/Plan: IUP at [redacted]w[redacted]d Active labor GBS neg FHR reassuring  Admit to b.s per c/w Dr Normand Sloop Routine L&D orders Epidural ASAP   Terrell Ostrand  M 04/24/2012, 7:08 AM

## 2012-04-24 NOTE — Anesthesia Procedure Notes (Signed)
Epidural Patient location during procedure: OB Start time: 04/24/2012 3:44 AM  Staffing Anesthesiologist: Brayton Caves R Performed by: anesthesiologist   Preanesthetic Checklist Completed: patient identified, site marked, surgical consent, pre-op evaluation, timeout performed, IV checked, risks and benefits discussed and monitors and equipment checked  Epidural Patient position: sitting Prep: site prepped and draped and DuraPrep Patient monitoring: continuous pulse ox and blood pressure Approach: midline Injection technique: LOR air and LOR saline  Needle:  Needle type: Tuohy  Needle gauge: 17 G Needle length: 9 cm and 9 Needle insertion depth: 6 cm Catheter type: closed end flexible Catheter size: 19 Gauge Catheter at skin depth: 10 cm Test dose: negative  Assessment Events: blood not aspirated, injection not painful, no injection resistance, negative IV test and no paresthesia  Additional Notes Patient identified.  Risk benefits discussed including failed block, incomplete pain control, headache, nerve damage, paralysis, blood pressure changes, nausea, vomiting, reactions to medication both toxic or allergic, and postpartum back pain.  Patient expressed understanding and wished to proceed.  All questions were answered.  Sterile technique used throughout procedure and epidural site dressed with sterile barrier dressing. No paresthesia or other complications noted.The patient did not experience any signs of intravascular injection such as tinnitus or metallic taste in mouth nor signs of intrathecal spread such as rapid motor block. Please see nursing notes for vital signs.

## 2012-04-25 LAB — CBC
MCV: 91.9 fL (ref 78.0–100.0)
Platelets: 199 10*3/uL (ref 150–400)
RBC: 3.22 MIL/uL — ABNORMAL LOW (ref 3.87–5.11)
WBC: 11.6 10*3/uL — ABNORMAL HIGH (ref 4.0–10.5)

## 2012-04-25 MED ORDER — IBUPROFEN 600 MG PO TABS: 600.0000 mg | ORAL_TABLET | Freq: Four times a day (QID) | ORAL | Status: DC | PRN

## 2012-04-25 MED ORDER — NORETHINDRONE 0.35 MG PO TABS: 1.0000 | ORAL_TABLET | Freq: Every day | ORAL | Status: DC

## 2012-04-25 NOTE — Discharge Summary (Signed)
Physician Discharge Summary  Patient ID: Simranjit Thayer MRN: [id number] DOB/AGE: [birth year] 36 y.o.  Admit date: 04/24/2012 Discharge date: 04/25/2012  Admission Diagnoses: [redacted]w[redacted]d active labor   Discharge Diagnoses:  Active Problems:  * No active hospital problems. 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 rate 20, height 5\' 9"  (1.753 m), weight 198 lb 8 oz (90.039 kg), last menstrual period 07/20/2011, SpO2 98.00%, unknown if currently breastfeeding. General appearance: alert, cooperative and no distress S: comfortable, little bleeding, slept     breastfeeding O VSS     abd soft, nt, ff      sm  flowperineum clean intact     -Homans sign bilaterally,       No edema  Disposition: Final discharge disposition not confirmed  Discharge Orders    Future Appointments: Provider: Department: Dept Phone: Center:   04/28/2012 1:45 PM Kirkland Hun, MD [facility] [phone] None   05/02/2012  7:30 AM [facility] [phone] None       Medication List     As of 04/25/2012  9:25 AM    STOP taking these medications         acetaminophen 500 MG tablet   Commonly known as: TYLENOL      ranitidine 75 MG tablet   Commonly known as: ZANTAC      TAKE these medications         calcium carbonate 500 MG chewable tablet   Commonly known as: TUMS - dosed in mg elemental calcium   Chew 2-3 tablets by mouth as needed. heartburn      ibuprofen 600 MG tablet   Commonly known as: ADVIL,MOTRIN   Take 1 tablet (600 mg total) by  mouth every 6 (six) hours as needed for pain.      prenatal multivitamin Tabs   Take 1 tablet by mouth daily.           Follow-up Information    Follow up with [facility]. In 6 weeks.   Contact information:   [address] [place] [address] [place] [address] [phone]       f/o 6 weeks, [facility] handbook  Signed: Lavera Guise 04/25/2012, 9:25 AM

## 2012-04-28 ENCOUNTER — Encounter: Payer: 59 | Admitting: Obstetrics and Gynecology

## 2012-05-02 ENCOUNTER — Inpatient Hospital Stay (HOSPITAL_COMMUNITY): Admission: RE | Admit: 2012-05-02 | Payer: 59 | Source: Ambulatory Visit | Admitting: Obstetrics and Gynecology

## 2012-06-08 ENCOUNTER — Ambulatory Visit (INDEPENDENT_AMBULATORY_CARE_PROVIDER_SITE_OTHER): Payer: 59 | Admitting: Obstetrics and Gynecology

## 2012-06-08 NOTE — Progress Notes (Signed)
Subjective:     Alicia Barber is a 36 y.o. female who presents for a postpartum visit.  I have fully reviewed the prenatal and intrapartum course.    Patient is not sexually active.   The following portions of the patient's history were reviewed and updated as appropriate: allergies, current medications, past family history, past medical history, past social history, past surgical history and problem list.  Breast feeding also supplementing due to limited supply, has been consulting w LC   Review of Systems Pertinent items are noted in HPI.   Objective:    BP 108/64  Ht 5\' 9"  (1.753 m)  Wt 191 lb (86.637 kg)  BMI 28.21 kg/m2  Breastfeeding? Yes  General:  alert, cooperative and no distress     Lungs: clear to auscultation bilaterally  Heart:  regular rate and rhythm, S1, S2 normal, no murmur  Abdomen: soft, non-tender; bowel sounds normal; no masses,  no organomegaly   Vulva:  normal  Vagina: normal vagina  Cervix:  normal  Corpus: normal size, contour, position, consistency, mobility, non-tender  Adnexa:  normal adnexa             Assessment:     Normal postpartum exam.  Pap smear not done at today's visit.  last pap march 2013 rv'd contraception, plans to conceive again in <35yr, undecided about using micronor but has RX    Plan:   RTO PRN or March for annual  Condoms for birth control   S.Mikeal Winstanley, CNM  06/08/2012 4:46 PM

## 2012-06-08 NOTE — Progress Notes (Signed)
Date of delivery: 04/24/12 Female Name: Alicia Barber Vaginal delivery:yes Cesarean section:no Tubal ligation:no GDM:no Breast Feeding:yes Bottle Feeding:yes Post-Partum Blues:no Abnormal pap:yes Normal GU function: yes Normal GI function:yes Returning to work:no EPDS: 1

## 2013-08-04 NOTE — L&D Delivery Note (Addendum)
0250:  Call from MAU reporting patient presents s/p ROM.  In to assess, patient 8/100/+2.  Patient calm and coping well.  FHR remained reassuring. Admitted to birthing suites and delivered as below with staff guidance.   Delivery Note At 3:33 AM a viable female "Alicia Barber" was delivered via Vaginal, Spontaneous Delivery (Presentation: Left Occiput Anterior).  After delivery of head, nuchal cord x3 noted, which infant and shoulders was delivered through via somersault maneuver.   Infant with spontaneous cry and immediately placed onto mother's chest.  Mother able to provide tactile stimulation and infant APGAR: 9, 10. Cord clamped and cut. Placenta delivered spontaneously via Duncan maneuver and noted to be intact with 3VC upon inspection. IM pitocin given in left thigh and cord blood collected by nurse. Infant to warmer; weight 9 lb 4.2 oz (4200 g).  Vaginal inspection revealed first degree perineal and right labial laceration that was repaired with local anesthetic. Fundus firm, bleeding small, infant skin to skin, and mother hemodynamically stable prior to provider exit.  Anesthesia: Local Episiotomy: None Lacerations:1st degree, Labial Suture Repair: 3.0 vicryl antimicrobial Est. Blood Loss (mL): 300  Mom to postpartum.  Baby to Couplet care / Skin to Skin.  Laniah Grimm Larita FifeLYNN, [name], CNM 06/01/2014, 5:32 AM

## 2013-10-20 LAB — OB RESULTS CONSOLE ABO/RH: RH Type: POSITIVE

## 2013-10-20 LAB — OB RESULTS CONSOLE ANTIBODY SCREEN: ANTIBODY SCREEN: NEGATIVE

## 2013-10-20 LAB — OB RESULTS CONSOLE HEPATITIS B SURFACE ANTIGEN: Hepatitis B Surface Ag: NEGATIVE

## 2013-10-20 LAB — OB RESULTS CONSOLE RUBELLA ANTIBODY, IGM: Rubella: IMMUNE

## 2013-10-20 LAB — OB RESULTS CONSOLE GC/CHLAMYDIA
CHLAMYDIA, DNA PROBE: NEGATIVE
Gonorrhea: NEGATIVE

## 2013-10-20 LAB — OB RESULTS CONSOLE HIV ANTIBODY (ROUTINE TESTING): HIV: NONREACTIVE

## 2014-03-06 LAB — OB RESULTS CONSOLE RPR: RPR: NONREACTIVE

## 2014-05-01 LAB — OB RESULTS CONSOLE GBS: GBS: NEGATIVE

## 2014-05-08 ENCOUNTER — Other Ambulatory Visit: Payer: Self-pay | Admitting: Obstetrics and Gynecology

## 2014-05-08 DIAGNOSIS — N6459 Other signs and symptoms in breast: Secondary | ICD-10-CM

## 2014-05-09 ENCOUNTER — Encounter (INDEPENDENT_AMBULATORY_CARE_PROVIDER_SITE_OTHER): Payer: Self-pay

## 2014-05-09 ENCOUNTER — Ambulatory Visit
Admission: RE | Admit: 2014-05-09 | Discharge: 2014-05-09 | Disposition: A | Discharge status: Home / Self Care | Payer: 59 | Source: Ambulatory Visit | Attending: Obstetrics and Gynecology | Admitting: Obstetrics and Gynecology

## 2014-05-09 DIAGNOSIS — N6459 Other signs and symptoms in breast: Secondary | ICD-10-CM

## 2014-05-23 ENCOUNTER — Inpatient Hospital Stay (HOSPITAL_COMMUNITY)
Admission: AD | Admit: 2014-05-23 | Discharge: 2014-05-23 | Disposition: A | Discharge status: Home / Self Care | Payer: 59 | Source: Ambulatory Visit | Attending: Obstetrics and Gynecology | Admitting: Obstetrics and Gynecology

## 2014-05-23 ENCOUNTER — Encounter (HOSPITAL_COMMUNITY): Payer: Self-pay | Admitting: *Deleted

## 2014-05-23 DIAGNOSIS — O09529 Supervision of elderly multigravida, unspecified trimester: Secondary | ICD-10-CM

## 2014-05-23 DIAGNOSIS — O09523 Supervision of elderly multigravida, third trimester: Secondary | ICD-10-CM | POA: Insufficient documentation

## 2014-05-23 DIAGNOSIS — K219 Gastro-esophageal reflux disease without esophagitis: Secondary | ICD-10-CM | POA: Diagnosis not present

## 2014-05-23 DIAGNOSIS — Z3A39 39 weeks gestation of pregnancy: Secondary | ICD-10-CM | POA: Diagnosis not present

## 2014-05-23 DIAGNOSIS — O471 False labor at or after 37 completed weeks of gestation: Secondary | ICD-10-CM

## 2014-05-23 DIAGNOSIS — O99613 Diseases of the digestive system complicating pregnancy, third trimester: Secondary | ICD-10-CM | POA: Diagnosis not present

## 2014-05-23 DIAGNOSIS — N6452 Nipple discharge: Secondary | ICD-10-CM | POA: Insufficient documentation

## 2014-05-23 DIAGNOSIS — B951 Streptococcus, group B, as the cause of diseases classified elsewhere: Secondary | ICD-10-CM

## 2014-05-23 DIAGNOSIS — J302 Other seasonal allergic rhinitis: Secondary | ICD-10-CM

## 2014-05-23 LAB — AMNISURE RUPTURE OF MEMBRANE (ROM) NOT AT ARMC: AMNISURE: NEGATIVE

## 2014-05-23 NOTE — MAU Provider Note (Signed)
History  38 yo G5P2022 @ 39.6 wks presents to MAU w/ c/o steady stream of clear fluid since 6:45 pm. States noted fluid continued to run down her leg after she showered. Denies ctxs or VB; reports good FM.   Patient Active Problem List   Diagnosis Date Noted  . False labor after 37 weeks of gestation without delivery 05/23/2014  . Elderly multigravida with antepartum condition or complication 05/23/2014  . Nipple discharge, bloody 05/23/2014  . GERD (gastroesophageal reflux disease) 05/23/2014  . Seasonal allergies 05/23/2014    Chief Complaint  Patient presents with  . Rupture of Membranes   HPI See above OB History   Grav Para Term Preterm Abortions TAB SAB Ect Mult Living   5 2 2  2  1   2       Past Medical History  Diagnosis Date  . H/O candidiasis   . GBS (group B Streptococcus carrier), +RV culture, currently pregnant   . Yeast infection   . Abnormal Pap smear 07/2010    repeat was negative    Past Surgical History  Procedure Laterality Date  . Wisdom tooth extraction      Family History  Problem Relation Age of Onset  . Other Other     had problems with anesthesia    History  Substance Use Topics  . Smoking status: Never Smoker   . Smokeless tobacco: Never Used  . Alcohol Use: No    Allergies: NKDA  No prescriptions prior to admission    ROS LOF +FM Occ ctx No VB Physical Exam   Results for orders placed during the hospital encounter of 05/23/14 (from the past 24 hour(s))  AMNISURE RUPTURE OF MEMBRANE (ROM)     Status: None   Collection Time    05/23/14  8:55 PM      Result Value Ref Range   Amnisure ROM NEGATIVE      Blood pressure 118/68, pulse 72, currently breastfeeding.  Physical Exam Gen: NAD Abd: soft, gravid, NT SSE: Neg valsalva, neg pooling, neg fern Pelvic: Cx: 3/thick/ballotable FHRT: BL 130 w/ mod variability, +accels, no decels Ctxs: Rare, irritability ED Course  Amnisure ordered  Assessment: Neg ROM Physiologic  discharge Cat 1 FHRT  Plan: Labor precautions Kick counts Keep f/u OB appt   Sherre ScarletWILLIAMS, Alizae Bechtel CNM, MS 05/23/2014 10:26 PM

## 2014-05-23 NOTE — MAU Note (Signed)
Pt reports ROM at 1845, some pressure.

## 2014-05-30 ENCOUNTER — Telehealth (HOSPITAL_COMMUNITY): Payer: Self-pay | Admitting: *Deleted

## 2014-05-30 ENCOUNTER — Encounter (HOSPITAL_COMMUNITY): Payer: Self-pay | Admitting: *Deleted

## 2014-05-30 NOTE — Telephone Encounter (Signed)
Preadmission screen  

## 2014-06-01 ENCOUNTER — Inpatient Hospital Stay (HOSPITAL_COMMUNITY): Admission: RE | Admit: 2014-06-01 | Payer: 59 | Source: Ambulatory Visit

## 2014-06-01 ENCOUNTER — Inpatient Hospital Stay (HOSPITAL_COMMUNITY)
Admission: AD | Admit: 2014-06-01 | Discharge: 2014-06-02 | DRG: 775 | Disposition: A | Discharge status: Home / Self Care | Payer: 59 | Source: Ambulatory Visit | Attending: Obstetrics and Gynecology | Admitting: Obstetrics and Gynecology

## 2014-06-01 ENCOUNTER — Encounter (HOSPITAL_COMMUNITY): Payer: Self-pay | Admitting: *Deleted

## 2014-06-01 DIAGNOSIS — O99613 Diseases of the digestive system complicating pregnancy, third trimester: Secondary | ICD-10-CM | POA: Diagnosis present

## 2014-06-01 DIAGNOSIS — O09523 Supervision of elderly multigravida, third trimester: Secondary | ICD-10-CM

## 2014-06-01 DIAGNOSIS — O9989 Other specified diseases and conditions complicating pregnancy, childbirth and the puerperium: Secondary | ICD-10-CM | POA: Diagnosis present

## 2014-06-01 DIAGNOSIS — Z3A41 41 weeks gestation of pregnancy: Secondary | ICD-10-CM | POA: Diagnosis present

## 2014-06-01 DIAGNOSIS — K219 Gastro-esophageal reflux disease without esophagitis: Secondary | ICD-10-CM | POA: Diagnosis present

## 2014-06-01 DIAGNOSIS — O480 Post-term pregnancy: Secondary | ICD-10-CM | POA: Diagnosis present

## 2014-06-01 DIAGNOSIS — O99824 Streptococcus B carrier state complicating childbirth: Secondary | ICD-10-CM | POA: Diagnosis present

## 2014-06-01 DIAGNOSIS — R12 Heartburn: Secondary | ICD-10-CM | POA: Diagnosis present

## 2014-06-01 DIAGNOSIS — O9902 Anemia complicating childbirth: Secondary | ICD-10-CM | POA: Diagnosis present

## 2014-06-01 DIAGNOSIS — IMO0001 Reserved for inherently not codable concepts without codable children: Secondary | ICD-10-CM

## 2014-06-01 DIAGNOSIS — D649 Anemia, unspecified: Secondary | ICD-10-CM | POA: Diagnosis present

## 2014-06-01 MED ORDER — ZOLPIDEM TARTRATE 5 MG PO TABS: 5.0000 mg | ORAL_TABLET | Freq: Every evening | ORAL | Status: DC | PRN

## 2014-06-01 MED ORDER — OXYTOCIN 40 UNITS IN LACTATED RINGERS INFUSION - SIMPLE MED: 62.5000 mL/h | INTRAVENOUS | Status: DC

## 2014-06-01 MED ORDER — DIBUCAINE 1 % RE OINT: 1.0000 "application " | TOPICAL_OINTMENT | RECTAL | Status: DC | PRN

## 2014-06-01 MED ORDER — OXYTOCIN 10 UNIT/ML IJ SOLN
INTRAMUSCULAR | Status: AC
  Filled 2014-06-01: qty 1

## 2014-06-01 MED ORDER — OXYCODONE-ACETAMINOPHEN 5-325 MG PO TABS: 2.0000 | ORAL_TABLET | ORAL | Status: DC | PRN

## 2014-06-01 MED ORDER — SENNOSIDES-DOCUSATE SODIUM 8.6-50 MG PO TABS
2.0000 | ORAL_TABLET | ORAL | Status: DC
  Administered 2014-06-02: 2 via ORAL
  Filled 2014-06-01: qty 2

## 2014-06-01 MED ORDER — ONDANSETRON HCL 4 MG/2ML IJ SOLN: 4.0000 mg | Freq: Four times a day (QID) | INTRAMUSCULAR | Status: DC | PRN

## 2014-06-01 MED ORDER — ONDANSETRON HCL 4 MG PO TABS: 4.0000 mg | ORAL_TABLET | ORAL | Status: DC | PRN

## 2014-06-01 MED ORDER — ONDANSETRON HCL 4 MG/2ML IJ SOLN: 4.0000 mg | INTRAMUSCULAR | Status: DC | PRN

## 2014-06-01 MED ORDER — WITCH HAZEL-GLYCERIN EX PADS: 1.0000 "application " | MEDICATED_PAD | CUTANEOUS | Status: DC | PRN

## 2014-06-01 MED ORDER — CITRIC ACID-SODIUM CITRATE 334-500 MG/5ML PO SOLN: 30.0000 mL | ORAL | Status: DC | PRN

## 2014-06-01 MED ORDER — LACTATED RINGERS IV SOLN: 500.0000 mL | INTRAVENOUS | Status: DC | PRN

## 2014-06-01 MED ORDER — OXYTOCIN BOLUS FROM INFUSION: 500.0000 mL | INTRAVENOUS | Status: DC

## 2014-06-01 MED ORDER — LANOLIN HYDROUS EX OINT: TOPICAL_OINTMENT | CUTANEOUS | Status: DC | PRN

## 2014-06-01 MED ORDER — OXYTOCIN 10 UNIT/ML IJ SOLN
INTRAMUSCULAR | Status: AC
  Administered 2014-06-01: 10 [IU]
  Filled 2014-06-01: qty 1

## 2014-06-01 MED ORDER — LIDOCAINE HCL (PF) 1 % IJ SOLN
30.0000 mL | INTRAMUSCULAR | Status: DC | PRN
  Filled 2014-06-01: qty 30

## 2014-06-01 MED ORDER — ACETAMINOPHEN 325 MG PO TABS: 650.0000 mg | ORAL_TABLET | ORAL | Status: DC | PRN

## 2014-06-01 MED ORDER — SIMETHICONE 80 MG PO CHEW: 80.0000 mg | CHEWABLE_TABLET | ORAL | Status: DC | PRN

## 2014-06-01 MED ORDER — OXYCODONE-ACETAMINOPHEN 5-325 MG PO TABS: 1.0000 | ORAL_TABLET | ORAL | Status: DC | PRN

## 2014-06-01 MED ORDER — PRENATAL MULTIVITAMIN CH
1.0000 | ORAL_TABLET | Freq: Every day | ORAL | Status: DC
Start: 2014-06-01 — End: 2014-06-02
  Administered 2014-06-01: 1 via ORAL
  Filled 2014-06-01: qty 1

## 2014-06-01 MED ORDER — BENZOCAINE-MENTHOL 20-0.5 % EX AERO
1.0000 "application " | INHALATION_SPRAY | CUTANEOUS | Status: DC | PRN
  Administered 2014-06-01 (×2): 1 via TOPICAL
  Filled 2014-06-01 (×2): qty 56

## 2014-06-01 MED ORDER — NALBUPHINE HCL 10 MG/ML IJ SOLN: 10.0000 mg | INTRAMUSCULAR | Status: DC | PRN

## 2014-06-01 MED ORDER — LIDOCAINE HCL (PF) 1 % IJ SOLN
INTRAMUSCULAR | Status: AC
  Filled 2014-06-01: qty 30

## 2014-06-01 MED ORDER — IBUPROFEN 600 MG PO TABS
600.0000 mg | ORAL_TABLET | Freq: Four times a day (QID) | ORAL | Status: DC
  Administered 2014-06-01 – 2014-06-02 (×5): 600 mg via ORAL
  Filled 2014-06-01 (×5): qty 1

## 2014-06-01 MED ORDER — TETANUS-DIPHTH-ACELL PERTUSSIS 5-2.5-18.5 LF-MCG/0.5 IM SUSP: 0.5000 mL | Freq: Once | INTRAMUSCULAR | Status: DC

## 2014-06-01 MED ORDER — LACTATED RINGERS IV SOLN: INTRAVENOUS | Status: DC

## 2014-06-01 MED ORDER — DIPHENHYDRAMINE HCL 25 MG PO CAPS: 25.0000 mg | ORAL_CAPSULE | Freq: Four times a day (QID) | ORAL | Status: DC | PRN

## 2014-06-01 NOTE — Progress Notes (Signed)
Subjective: Postpartum Day 0: Vaginal delivery, no laceration Patient up ad lib, reports no syncope or dizziness. Feeding:  Breast Contraceptive plan:  Likely NFP--interested in having another baby in next 12 months.  Objective: Vital signs in last 24 hours: Temp:  [97.8 F (36.6 C)-98.4 F (36.9 C)] 97.9 F (36.6 C) (10/29 1015) Pulse Rate:  [57-70] 67 (10/29 1015) Resp:  [16-20] 20 (10/29 1015) BP: (99-127)/(56-79) 108/56 mmHg (10/29 1015) SpO2:  [98 %] 98 % (10/29 1015) Weight:  [199 lb (90.266 kg)] 199 lb (90.266 kg) (10/29 0309)  Physical Exam:  General: alert Lochia: appropriate Uterine Fundus: firm Perineum: Perineum intact DVT Evaluation: No evidence of DVT seen on physical exam. Negative Homan's sign.   Assessment/Plan: Status post vaginal delivery day 0 Stable Continue current care. Plan for discharge tomorrow Family plans inpatient circumcision--Dr. Stefano GaulStringer aware.   Nyra CapesLATHAM, VICKICNM 06/01/2014, 12:28 PM

## 2014-06-01 NOTE — Lactation Note (Signed)
This note was copied from the chart of [name]. Lactation Consultation Note Experienced BF mom, 1st child BF 2 months, stopped d/t low milk supply. 2nd child BF 4 months stopped d/t low milk supply. Mom states that she was watching her weight and not drinking enough fluids and she has small breast, so she thinks that might be why she had low milk supply. Noted small glandular breast tissue, tiny areolas, and good everted nipples. Denies PCOS. Noted small gap between breast. DEBP encouraged to post-pump after feedings to stimulate milk supply. Mom shown how to use DEBP & how to disassemble, clean, & reassemble parts.Hand expression taught to Mom, noted colostrum. Mom encouraged to feed baby 8-12 times/24 hours and with feeding cues. Mom reports + breast changes w/pregnancy. Mom knows to pump q3h for 15-20 min.Mom encouraged to do skin-to-skin.Mom encouraged to waken baby for feeds. Referred to Baby and Me Book in Breastfeeding section Pg. 22-23 for position options and Proper latch demonstration.[facility] brochure given w/resources, support groups and LC services. Encouraged fluids and breast massage during feedings.  Patient Name: Boy Alicia Albeelizabeth Charlie WUJWJ'XToday's Date: 06/01/2014 Reason for consult: Initial assessment   Maternal Data Has patient been taught Hand Expression?: Yes Does the patient have breastfeeding experience prior to this delivery?: Yes  Feeding Feeding Type: Breast Fed Length of feed: 60 min  LATCH Score/Interventions Latch: Grasps breast easily, tongue down, lips flanged, rhythmical sucking.  Audible Swallowing: A few with stimulation Intervention(s): Skin to skin  Type of Nipple: Everted at rest and after stimulation  Comfort (Breast/Nipple): Soft / non-tender     Hold (Positioning): No assistance needed to correctly position infant at breast. Intervention(s): Breastfeeding basics reviewed;Support Pillows;Position options;Skin to skin  LATCH Score:  9  Lactation Tools Discussed/Used Tools: Pump Breast pump type: Double-Electric Breast Pump WIC Program: No Pump Review: Setup, frequency, and cleaning;Milk Storage Initiated by:: Alicia JeffersonL. Chinmayi Rumer RN Date initiated:: 06/01/14   Consult Status Consult Status: Follow-up Date: 06/01/14 Follow-up type: In-patient    Alicia Barber, Diamond NickelLAURA Barber 06/01/2014, 8:12 AM

## 2014-06-01 NOTE — H&P (Signed)
Alicia Barber is a 38 y.o. female, G5P2012 at 41.1 weeks, presenting for SROM.  Patient states she awoke throughout the night with cramping, but at 0200 cramping intensified and upon standing she noted large gush of fluid.  Patient reports cramping turned into contractions while in route to the hospital.  Patient desires epidural and is GBS negative.   Patient Active Problem List   Diagnosis Date Noted  . False labor after 37 weeks of gestation without delivery 05/23/2014  . Elderly multigravida with antepartum condition or complication 05/23/2014  . Nipple discharge, bloody 05/23/2014  . GERD (gastroesophageal reflux disease) 05/23/2014  . Seasonal allergies 05/23/2014    History of present pregnancy: Patient entered care at 10 weeks.   EDC of 05/24/2014 was established by Definite LMP of 08/17/2013.   Anatomy scan:  18.6 weeks, with normal findings and an anterior placenta.   Additional US evaluations:   -10wk Viability US confirmed LMP -Breast US at 37.5wks Significant prenatal events:  1st Trimester: Genetic Testing WNL.  Patient complain of heartburn and started on Protonix after unsuccessful usage of Tums. 2nd Trimester: No complaints  3rd Trimester: C/O OF SMALL BUMP IN R VULVAR AREA. ON EXAM, 2MM HYPERPIGMENTED PAPULE AT RIGHT MID LABIA AREA, NON-TENDER. LIKELYNEVUS VERSUS SMALL BLOOD COLLECTION. EVALUATION OF BLOODY DISCHARGE FROM RIGHT BREAST--US NORMAL---DUCTOGRAM RECOMMENDED AFTER DELIVERY Last evaluation:  05/26/2014 by Dr. Kathie RhodesS. Barber---3/60/-2 with Membrane Stripping  OB History   Grav Para Term Preterm Abortions TAB SAB Ect Mult Living   5 2 2  2  1   2     -03/2007: Boy Alicia Barber--NSVD, No Anesthesia, 41.5wks, 8lbs 2oz -04/2012: Boy Alicia Barber-NSVD, Epid, 40.4wks, 8lbs 3oz   Past Medical History  Diagnosis Date  . H/O candidiasis   . GBS (group B Streptococcus carrier), +RV culture, currently pregnant   . Yeast infection   . Abnormal Pap smear 07/2010    repeat was  negative  . Hx of varicella    Past Surgical History  Procedure Laterality Date  . Wisdom tooth extraction     Family History: family history includes Other in her other. Social History:  reports that she has never smoked. She has never used smokeless tobacco. She reports that she does not drink alcohol or use illicit drugs.  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    Prenatal Transfer Tool  Maternal Diabetes: No Genetic Screening: Normal Maternal Ultrasounds/Referrals: Normal Fetal Ultrasounds or other Referrals:  None Maternal Substance Abuse:  No Significant Maternal Medications:  Meds include: Protonix Other: PNV, Claritin Significant Maternal Lab Results: Lab values include: Group B Strep negative    ROS: +Ctx, +LOF, +FM, -VB  No Known Allergies   Dilation: 8 (slide to perform ferning obtained.) Effacement (%): 100 Station: +2 Exam by:: Alicia Barber CNM currently breastfeeding.  FHR: 125 bpm, Mod Var, -Decels, +Accels UCs:  Q1-753min, palpates moderate  Prenatal labs: ABO, Rh:  A Positive Antibody:  Negative Rubella:   Immune RPR:   NR HBsAg:   Negative HIV:   Negative GBS:  Negative Sickle cell/Hgb electrophoresis:  N/A Pap:  Unknown GC:  Negative Chlamydia:  Negative Genetic screenings:  Negative Glucola:  Negative Other:  None    Assessment IUP at 41.1wks 2nd Stage Labor  [facility]: [facility] Routine Labor and Delivery Orders per [facility] Protocol Anticipate SVD Dr. Katharine LookJ. Barber updated  [facility] HospitalEMLY, Alicia Barber Jerral RalphLYNNCNM, Alicia Barber 06/01/2014, 3:01 AM

## 2014-06-02 LAB — CBC
HCT: 30 % — ABNORMAL LOW (ref 36.0–46.0)
Hemoglobin: 10 g/dL — ABNORMAL LOW (ref 12.0–15.0)
MCH: 30.3 pg (ref 26.0–34.0)
MCHC: 33.3 g/dL (ref 30.0–36.0)
MCV: 90.9 fL (ref 78.0–100.0)
Platelets: 209 10*3/uL (ref 150–400)
RBC: 3.3 MIL/uL — ABNORMAL LOW (ref 3.87–5.11)
RDW: 12.2 % (ref 11.5–15.5)
WBC: 10.3 10*3/uL (ref 4.0–10.5)

## 2014-06-02 MED ORDER — IBUPROFEN 600 MG PO TABS: 600.0000 mg | ORAL_TABLET | Freq: Four times a day (QID) | ORAL | Status: DC

## 2014-06-02 MED ORDER — FERROUS SULFATE 325 (65 FE) MG PO TABS
325.0000 mg | ORAL_TABLET | Freq: Two times a day (BID) | ORAL | Status: DC
Start: 2014-06-02 — End: 2014-06-02

## 2014-06-02 NOTE — Lactation Note (Signed)
This note was copied from the chart of Alicia Jonita Albeelizabeth Debruler. Lactation Consultation Note  Baby latched in cradle position upon entering the room.  Suggest putting pillows under baby to bring him to breast height. Reviewed engorgement care and provided mother with a breastpump. Encouraged mother to call if she has further questions.  Discussed milk supply.  Patient Name: Alicia Barber ZOXWR'UToday's Date: 06/02/2014 Reason for consult: Follow-up assessment   Maternal Data    Feeding Feeding Type: Breast Fed  LATCH Score/Interventions Latch: Grasps breast easily, tongue down, lips flanged, rhythmical sucking.  Audible Swallowing: Spontaneous and intermittent  Type of Nipple: Everted at rest and after stimulation  Comfort (Breast/Nipple): Soft / non-tender     Hold (Positioning): Assistance needed to correctly position infant at breast and maintain latch.  LATCH Score: 9  Lactation Tools Discussed/Used     Consult Status Consult Status: PRN    Dahlia ByesBerkelhammer, Marquise Lambson [facility] 06/02/2014, 10:44 AM

## 2014-06-02 NOTE — Discharge Summary (Signed)
Vaginal Delivery Discharge Summary   Alicia Barber, [place] [id number]  DOB:    27-Dec-1975 MRN:    [id number] CSN:    [id number]  Date of admission:                  06/01/14  Date of discharge:                   06/02/14  Procedures this admission: SVD by Gerrit Heck CNM  Date of Delivery: 06/01/14  Newborn Data:  Live born  Information for the patient's newborn:  Yadhira, Mckneely [[id number]]  female Live born female  Birth Weight: 9 lb 4.2 oz (4200 g) APGAR: 9, 10  Home with mother. Name: Oneal Deputy Circumcision Plan: in patient  History of Present Illness: Alicia Barber is a 38 y.o. female, [phone], who presents at [redacted]w[redacted]d weeks gestation. The patient has been followed at the [facility] Gy[facility]cology division of [facility]. She was admitted onset of labor. Her pregnancy has been complicated by:   Patient Active Problem List   Diagnosis Date Noted  . First degree perineal laceration during delivery 06/01/2014  . SVD (spontaneous vaginal delivery) 06/01/2014  . False labor after 37 weeks of gestation without delivery 05/23/2014  . Elderly multigravida with antepartum condition or complication 05/23/2014  . Nipple discharge, bloody 05/23/2014  . GERD (gastroesophageal reflux disease) 05/23/2014  . Seasonal allergies 05/23/2014    Hospital course: The patient was admitted for labor.   Her labor was not complicated. She proceeded to have a vaginal delivery of a healthy infant. Her delivery was not complicated. Her postpartum course was not complicated. She was discharged to home on postpartum day 1 doing well.  Feeding: breast  Contraception: natural family planning (NFP)  Discharge hemoglobin: Hemoglobin  Date Value Ref Range Status  04/25/2012 10.1* 12.0 - 15.0 g/dL Final     HCT  Date Value Ref Range Status  04/25/2012 29.6* 36.0 - 46.0 % Final    PreNatal Labs ABO, Rh: A/Positive/-- (03/19  0000)   Antibody: Negative (03/19 0000) Rubella:   immune RPR: Nonreactive (08/03 0000)  HBsAg: Negative (03/19 0000)  HIV: Non-reactive (03/19 0000)  GBS: Negative (09/28 0000)   Discharge Physical Exam:  General: alert and cooperative Lochia: appropriate Uterine Fundus: firm Incision: healing well DVT Evaluation: No evidence of DVT seen on physical exam.  Intrapartum Procedures: spontaneous vaginal delivery Postpartum Procedures: none Complications-Operative and Postpartum: 1 degree perineal laceration  Discharge Diagnoses: Post-date pregnancy  Activity:           pelvic rest Diet:                routine Medications: PNV, Ibuprofen and Iron Condition:      stable, anemia hemodynamically stable     Postpartum Teaching: Nutrition, exercise, return to work or school, family visits, sexual activity, home rest, vaginal bleeding, pelvic rest, family planning, s/s of PPD, breast care peri-care and incision care   Discharge to: home  Follow-up Information   Follow up with [facility]. Schedule an appointment as soon as possible for a visit in 6 weeks. (For a 6 week postpartum visit)    Specialty:  Obstetrics and Gynecology   Contact information:   [address] [place] [address] [phone]       Adelina Mings, CNM, [name] 06/02/2014. 8:05 AM   Postpartum Care After Vaginal Delivery  After you deliver your newborn (postpartum  period), the usual stay in the hospital is 24 72 hours. If there were problems with your labor or delivery, or if you have other medical problems, you might be in the hospital longer.  While you are in the hospital, you will receive help and instructions on how to care for yourself and your newborn during the postpartum period.  While you are in the hospital:  Be sure to tell your nurses if you have pain or discomfort, as well as where you feel the pain and what makes the pain worse.  If you  had an incision made near your vagina (episiotomy) or if you had some tearing during delivery, the nurses may put ice packs on your episiotomy or tear. The ice packs may help to reduce the pain and swelling.  If you are breastfeeding, you may feel uncomfortable contractions of your uterus for a couple of weeks. This is normal. The contractions help your uterus get back to normal size.  It is normal to have some bleeding after delivery.  For the first 1 3 days after delivery, the flow is red and the amount may be similar to a period.  It is common for the flow to start and stop.  In the first few days, you may pass some small clots. Let your nurses know if you begin to pass large clots or your flow increases.  Do not  flush blood clots down the toilet before having the nurse look at them.  During the next 3 10 days after delivery, your flow should become more watery and pink or brown-tinged in color.  Ten to fourteen days after delivery, your flow should be a small amount of yellowish-white discharge.  The amount of your flow will decrease over the first few weeks after delivery. Your flow may stop in 6 8 weeks. Most women have had their flow stop by 12 weeks after delivery.  You should change your sanitary pads frequently.  Wash your hands thoroughly with soap and water for at least 20 seconds after changing pads, using the toilet, or before holding or feeding your newborn.  You should feel like you need to empty your bladder within the first 6 8 hours after delivery.  In case you become weak, lightheaded, or faint, call your nurse before you get out of bed for the first time and before you take a shower for the first time.  Within the first few days after delivery, your breasts may begin to feel tender and full. This is called engorgement. Breast tenderness usually goes away within 48 72 hours after engorgement occurs. You may also notice milk leaking from your breasts. If you are not  breastfeeding, do not stimulate your breasts. Breast stimulation can make your breasts produce more milk.  Spending as much time as possible with your newborn is very important. During this time, you and your newborn can feel close and get to know each other. Having your newborn stay in your room (rooming in) will help to strengthen the bond with your newborn. It will give you time to get to know your newborn and become comfortable caring for your newborn.  Your hormones change after delivery. Sometimes the hormone changes can temporarily cause you to feel sad or tearful. These feelings should not last more than a few days. If these feelings last longer than that, you should talk to your caregiver.  If desired, talk to your caregiver about methods of family planning or contraception.  Talk to your  caregiver about immunizations. Your caregiver may want you to have the following immunizations before leaving the hospital:  Tetanus, diphtheria, and pertussis (Tdap) or tetanus and diphtheria (Td) immunization. It is very important that you and your family (including grandparents) or others caring for your newborn are up-to-date with the Tdap or Td immunizations. The Tdap or Td immunization can help protect your newborn from getting ill.  Rubella immunization.  Varicella (chickenpox) immunization.  Influenza immunization. You should receive this annual immunization if you did not receive the immunization during your pregnancy. Document Released: 05/18/2007 Document Revised: 04/14/2012 Document Reviewed: 03/17/2012 [facility] Patient Information 2014 [place].   Postpartum Depression and Baby Blues  The postpartum period begins right after the birth of a baby. During this time, there is often a great amount of joy and excitement. It is also a time of considerable changes in the life of the parent(s). Regardless of how many times a mother gives birth, each child brings new challenges and  dynamics to the family. It is not unusual to have feelings of excitement accompanied by confusing shifts in moods, emotions, and thoughts. All mothers are at risk of developing postpartum depression or the "baby blues." These mood changes can occur right after giving birth, or they may occur many months after giving birth. The baby blues or postpartum depression can be mild or severe. Additionally, postpartum depression can resolve rather quickly, or it can be a long-term condition. CAUSES Elevated hormones and their rapid decline are thought to be a main cause of postpartum depression and the baby blues. There are a number of hormones that radically change during and after pregnancy. Estrogen and progesterone usually decrease immediately after delivering your baby. The level of thyroid hormone and various cortisol steroids also rapidly drop. Other factors that play a major role in these changes include major life events and genetics.  RISK FACTORS If you have any of the following risks for the baby blues or postpartum depression, know what symptoms to watch out for during the postpartum period. Risk factors that may increase the likelihood of getting the baby blues or postpartum depression include:  Havinga personal or family history of depression.  Having depression while being pregnant.  Having premenstrual or oral contraceptive-associated mood issues.  Having exceptional life stress.  Having marital conflict.  Lacking a social support network.  Having a baby with special needs.  Having health problems such as diabetes. SYMPTOMS Baby blues symptoms include:  Brief fluctuations in mood, such as going from extreme happiness to sadness.  Decreased concentration.  Difficulty sleeping.  Crying spells, tearfulness.  Irritability.  Anxiety. Postpartum depression symptoms typically begin within the first month after giving birth. These symptoms include:  Difficulty sleeping or  excessive sleepiness.  Marked weight loss.  Agitation.  Feelings of worthlessness.  Lack of interest in activity or food. Postpartum psychosis is a very concerning condition and can be dangerous. Fortunately, it is rare. Displaying any of the following symptoms is cause for immediate medical attention. Postpartum psychosis symptoms include:  Hallucinations and delusions.  Bizarre or disorganized behavior.  Confusion or disorientation. DIAGNOSIS  A diagnosis is made by an evaluation of your symptoms. There are no medical or lab tests that lead to a diagnosis, but there are various questionnaires that a caregiver may use to identify those with the baby blues, postpartum depression, or psychosis. Often times, a screening tool called the [place] Postnatal Depression Scale is used to diagnose depression in the postpartum period.  TREATMENT The  baby blues usually goes away on its own in 1 to 2 weeks. Social support is often all that is needed. You should be encouraged to get adequate sleep and rest. Occasionally, you may be given medicines to help you sleep.  Postpartum depression requires treatment as it can last several months or longer if it is not treated. Treatment may include individual or group therapy, medicine, or both to address any social, physiological, and psychological factors that may play a role in the depression. Regular exercise, a healthy diet, rest, and social support may also be strongly recommended.  Postpartum psychosis is more serious and needs treatment right away. Hospitalization is often needed. HOME CARE INSTRUCTIONS  Get as much rest as you can. Nap when the baby sleeps.  Exercise regularly. Some women find yoga and walking to be beneficial.  Eat a balanced and nourishing diet.  Do little things that you enjoy. Have a cup of tea, take a bubble bath, read your favorite magazine, or listen to your favorite music.  Avoid alcohol.  Ask for help with household  chores, cooking, grocery shopping, or running errands as needed. Do not try to do everything.  Talk to people close to you about how you are feeling. Get support from your partner, family members, friends, or other new moms.  Try to stay positive in how you think. Think about the things you are grateful for.  Do not spend a lot of time alone.  Only take medicine as directed by your caregiver.  Keep all your postpartum appointments.  Let your caregiver know if you have any concerns. SEEK MEDICAL CARE IF: You are having a reaction or problems with your medicine. SEEK IMMEDIATE MEDICAL CARE IF:  You have suicidal feelings.  You feel you may harm the baby or someone else. Document Released: 04/24/2004 Document Revised: 10/13/2011 Document Reviewed: 05/27/2011 [facility] Patient Information 2014 [place], [place].     Breastfeeding Deciding to breastfeed is one of the best choices you can make for you and your baby. A change in hormones during pregnancy causes your breast tissue to grow and increases the number and size of your milk ducts. These hormones also allow proteins, sugars, and fats from your blood supply to make breast milk in your milk-producing glands. Hormones prevent breast milk from being released before your baby is born as well as prompt milk flow after birth. Once breastfeeding has begun, thoughts of your baby, as well as his or her sucking or crying, can stimulate the release of milk from your milk-producing glands.  BENEFITS OF BREASTFEEDING For Your Baby  Your first milk (colostrum) helps your baby's digestive system function better.   There are antibodies in your milk that help your baby fight off infections.   Your baby has a lower incidence of asthma, allergies, and sudden infant death syndrome.   The nutrients in breast milk are better for your baby than infant formulas and are designed uniquely for your baby's needs.   Breast milk improves your baby's brain  development.   Your baby is less likely to develop other conditions, such as childhood obesity, asthma, or type 2 diabetes mellitus.  For You   Breastfeeding helps to create a very special bond between you and your baby.   Breastfeeding is convenient. Breast milk is always available at the correct temperature and costs nothing.   Breastfeeding helps to burn calories and helps you lose the weight gained during pregnancy.   Breastfeeding makes your uterus contract to its  prepregnancy size faster and slows bleeding (lochia) after you give birth.   Breastfeeding helps to lower your risk of developing type 2 diabetes mellitus, osteoporosis, and breast or ovarian cancer later in life. SIGNS THAT YOUR BABY IS HUNGRY Early Signs of Hunger  Increased alertness or activity.  Stretching.  Movement of the head from side to side.  Movement of the head and opening of the mouth when the corner of the mouth or cheek is stroked (rooting).  Increased sucking sounds, smacking lips, cooing, sighing, or squeaking.  Hand-to-mouth movements.  Increased sucking of fingers or hands. Late Signs of Hunger  Fussing.  Intermittent crying. Extreme Signs of Hunger Signs of extreme hunger will require calming and consoling before your baby will be able to breastfeed successfully. Do not wait for the following signs of extreme hunger to occur before you initiate breastfeeding:   Restlessness.  A loud, strong cry.   Screaming.   BREASTFEEDING BASICS Breastfeeding Initiation  Find a comfortable place to sit or lie down, with your neck and back well supported.  Place a pillow or rolled up blanket under your baby to bring him or her to the level of your breast (if you are seated). Nursing pillows are specially designed to help support your arms and your baby while you breastfeed.  Make sure that your baby's abdomen is facing your abdomen.   Gently massage your breast. With your fingertips,  massage from your chest wall toward your nipple in a circular motion. This encourages milk flow. You may need to continue this action during the feeding if your milk flows slowly.  Support your breast with 4 fingers underneath and your thumb above your nipple. Make sure your fingers are well away from your nipple and your baby's mouth.   Stroke your baby's lips gently with your finger or nipple.   When your baby's mouth is open wide enough, quickly bring your baby to your breast, placing your entire nipple and as much of the colored area around your nipple (areola) as possible into your baby's mouth.   More areola should be visible above your baby's upper lip than below the lower lip.   Your baby's tongue should be between his or her lower gum and your breast.   Ensure that your baby's mouth is correctly positioned around your nipple (latched). Your baby's lips should create a seal on your breast and be turned out (everted).  It is common for your baby to suck about 2-3 minutes in order to start the flow of breast milk. Latching Teaching your baby how to latch on to your breast properly is very important. An improper latch can cause nipple pain and decreased milk supply for you and poor weight gain in your baby. Also, if your baby is not latched onto your nipple properly, he or she may swallow some air during feeding. This can make your baby fussy. Burping your baby when you switch breasts during the feeding can help to get rid of the air. However, teaching your baby to latch on properly is still the best way to prevent fussiness from swallowing air while breastfeeding. Signs that your baby has successfully latched on to your nipple:    Silent tugging or silent sucking, without causing you pain.   Swallowing heard between every 3-4 sucks.    Muscle movement above and in front of his or her ears while sucking.  Signs that your baby has not successfully latched on to nipple:    Sucking  sounds or smacking sounds from your baby while breastfeeding.  Nipple pain. If you think your baby has not latched on correctly, slip your finger into the corner of your baby's mouth to break the suction and place it between your baby's gums. Attempt breastfeeding initiation again. Signs of Successful Breastfeeding Signs from your baby:   A gradual decrease in the number of sucks or complete cessation of sucking.   Falling asleep.   Relaxation of his or her body.   Retention of a small amount of milk in his or her mouth.   Letting go of your breast by himself or herself. Signs from you:  Breasts that have increased in firmness, weight, and size 1-3 hours after feeding.   Breasts that are softer immediately after breastfeeding.  Increased milk volume, as well as a change in milk consistency and color by the fifth day of breastfeeding.   Nipples that are not sore, cracked, or bleeding. Signs That Your Pecola Leisure is Getting Enough Milk  Wetting at least 3 diapers in a 24-hour period. The urine should be clear and pale yellow by age 942 days.  At least 3 stools in a 24-hour period by age 942 days. The stool should be soft and yellow.  At least 3 stools in a 24-hour period by age 62 days. The stool should be seedy and yellow.  No loss of weight greater than 10% of birth weight during the first 77 days of age.  Average weight gain of 4-7 ounces (113-198 g) per week after age 27 days.  Consistent daily weight gain by age 942 days, without weight loss after the age of 2 weeks. After a feeding, your baby may spit up a small amount. This is common. BREASTFEEDING FREQUENCY AND DURATION Frequent feeding will help you make more milk and can prevent sore nipples and breast engorgement. Breastfeed when you feel the need to reduce the fullness of your breasts or when your baby shows signs of hunger. This is called "breastfeeding on demand." Avoid introducing a pacifier to your baby while  you are working to establish breastfeeding (the first 4-6 weeks after your baby is born). After this time you may choose to use a pacifier. Research has shown that pacifier use during the first year of a baby's life decreases the risk of sudden infant death syndrome (SIDS). Allow your baby to feed on each breast as long as he or she wants. Breastfeed until your baby is finished feeding. When your baby unlatches or falls asleep while feeding from the first breast, offer the second breast. Because newborns are often sleepy in the first few weeks of life, you may need to awaken your baby to get him or her to feed. Breastfeeding times will vary from baby to baby. However, the following rules can serve as a guide to help you ensure that your baby is properly fed:  Newborns (babies 63 weeks of age or younger) may breastfeed every 1-3 hours.  Newborns should not go longer than 3 hours during the day or 5 hours during the night without breastfeeding.  You should breastfeed your baby a minimum of 8 times in a 24-hour period until you begin to introduce solid foods to your baby at around 59 months of age. BREAST MILK PUMPING Pumping and storing breast milk allows you to ensure that your baby is exclusively fed your breast milk, even at times when you are unable to breastfeed. This is especially important if you are going back to work  while you are still breastfeeding or when you are not able to be present during feedings. Your lactation consultant can give you guidelines on how long it is safe to store breast milk.  A breast pump is a machine that allows you to pump milk from your breast into a sterile bottle. The pumped breast milk can then be stored in a refrigerator or freezer. Some breast pumps are operated by hand, while others use electricity. Ask your lactation consultant which type will work best for you. Breast pumps can be purchased, but some hospitals and breastfeeding support groups lease breast pumps on  a monthly basis. A lactation consultant can teach you how to hand express breast milk, if you prefer not to use a pump.  CARING FOR YOUR BREASTS WHILE YOU BREASTFEED Nipples can become dry, cracked, and sore while breastfeeding. The following recommendations can help keep your breasts moisturized and healthy:  Avoid using soap on your nipples.   Wear a supportive bra. Although not required, special nursing bras and tank tops are designed to allow access to your breasts for breastfeeding without taking off your entire bra or top. Avoid wearing underwire-style bras or extremely tight bras.  Air dry your nipples for 3-194minutes after each feeding.   Use only cotton bra pads to absorb leaked breast milk. Leaking of breast milk between feedings is normal.   Use lanolin on your nipples after breastfeeding. Lanolin helps to maintain your skin's normal moisture barrier. If you use pure lanolin, you do not need to wash it off before feeding your baby again. Pure lanolin is not toxic to your baby. You may also hand express a few drops of breast milk and gently massage that milk into your nipples and allow the milk to air dry. In the first few weeks after giving birth, some women experience extremely full breasts (engorgement). Engorgement can make your breasts feel heavy, warm, and tender to the touch. Engorgement peaks within 3-5 days after you give birth. The following recommendations can help ease engorgement:  Completely empty your breasts while breastfeeding or pumping. You may want to start by applying warm, moist heat (in the shower or with warm water-soaked hand towels) just before feeding or pumping. This increases circulation and helps the milk flow. If your baby does not completely empty your breasts while breastfeeding, pump any extra milk after he or she is finished.  Wear a snug bra (nursing or regular) or tank top for 1-2 days to signal your body to slightly decrease milk  production.  Apply ice packs to your breasts, unless this is too uncomfortable for you.  Make sure that your baby is latched on and positioned properly while breastfeeding. If engorgement persists after 48 hours of following these recommendations, contact your health care provider or a Advertising copywriterlactation consultant. OVERALL HEALTH CARE RECOMMENDATIONS WHILE BREASTFEEDING  Eat healthy foods. Alternate between meals and snacks, eating 3 of each per day. Because what you eat affects your breast milk, some of the foods may make your baby more irritable than usual. Avoid eating these foods if you are sure that they are negatively affecting your baby.  Drink milk, fruit juice, and water to satisfy your thirst (about 10 glasses a day).   Rest often, relax, and continue to take your prenatal vitamins to prevent fatigue, stress, and anemia.  Continue breast self-awareness checks.  Avoid chewing and smoking tobacco.  Avoid alcohol and drug use. Some medicines that may be harmful to your baby can pass through  breast milk. It is important to ask your health care provider before taking any medicine, including all over-the-counter and prescription medicine as well as vitamin and herbal supplements. It is possible to become pregnant while breastfeeding. If birth control is desired, ask your health care provider about options that will be safe for your baby. SEEK MEDICAL CARE IF:   You feel like you want to stop breastfeeding or have become frustrated with breastfeeding.  You have painful breasts or nipples.  Your nipples are cracked or bleeding.  Your breasts are red, tender, or warm.  You have a swollen area on either breast.  You have a fever or chills.  You have nausea or vomiting.  You have drainage other than breast milk from your nipples.  Your breasts do not become full before feedings by the fifth day after you give birth.  You feel sad and depressed.  Your baby is too sleepy to eat  well.  Your baby is having trouble sleeping.   Your baby is wetting less than 3 diapers in a 24-hour period.  Your baby has less than 3 stools in a 24-hour period.  Your baby's skin or the white part of his or her eyes becomes yellow.   Your baby is not gaining weight by 82 days of age. SEEK IMMEDIATE MEDICAL CARE IF:   Your baby is overly tired (lethargic) and does not want to wake up and feed.  Your baby develops an unexplained fever. Document Released: 07/21/2005 Document Revised: 07/26/2013 Document Reviewed: 01/12/2013 [facility] Patient Information 2015 [place], [place]. This information is not intended to replace advice given to you by your health care provider. Make sure you discuss any questions you have with your health care provider.

## 2014-06-05 ENCOUNTER — Encounter (HOSPITAL_COMMUNITY): Payer: Self-pay | Admitting: *Deleted

## 2014-07-07 ENCOUNTER — Other Ambulatory Visit: Payer: Self-pay | Admitting: Obstetrics and Gynecology

## 2014-07-07 DIAGNOSIS — N6452 Nipple discharge: Secondary | ICD-10-CM

## 2014-07-17 ENCOUNTER — Ambulatory Visit
Admission: RE | Admit: 2014-07-17 | Discharge: 2014-07-17 | Disposition: A | Discharge status: Home / Self Care | Payer: 59 | Source: Ambulatory Visit | Attending: Obstetrics and Gynecology | Admitting: Obstetrics and Gynecology

## 2014-07-17 DIAGNOSIS — N6452 Nipple discharge: Secondary | ICD-10-CM

## 2014-07-17 MED ORDER — GADOBENATE DIMEGLUMINE 529 MG/ML IV SOLN
17.0000 mL | Freq: Once | INTRAVENOUS | Status: AC | PRN
  Administered 2014-07-17: 17 mL via INTRAVENOUS

## 2016-10-13 ENCOUNTER — Encounter: Payer: Self-pay | Admitting: Obstetrics and Gynecology

## 2016-10-14 ENCOUNTER — Other Ambulatory Visit: Payer: Self-pay | Admitting: Obstetrics and Gynecology

## 2016-10-14 DIAGNOSIS — N63 Unspecified lump in unspecified breast: Secondary | ICD-10-CM

## 2016-10-16 ENCOUNTER — Ambulatory Visit
Admission: RE | Admit: 2016-10-16 | Discharge: 2016-10-16 | Disposition: A | Discharge status: Home / Self Care | Payer: Managed Care, Other (non HMO) | Source: Ambulatory Visit | Attending: Obstetrics and Gynecology | Admitting: Obstetrics and Gynecology

## 2016-10-16 DIAGNOSIS — N63 Unspecified lump in unspecified breast: Secondary | ICD-10-CM

## 2017-12-24 ENCOUNTER — Other Ambulatory Visit: Payer: Self-pay | Admitting: Obstetrics and Gynecology

## 2017-12-24 DIAGNOSIS — Z1231 Encounter for screening mammogram for malignant neoplasm of breast: Secondary | ICD-10-CM

## 2018-01-20 ENCOUNTER — Ambulatory Visit
Admission: RE | Admit: 2018-01-20 | Discharge: 2018-01-20 | Disposition: A | Discharge status: Home / Self Care | Payer: 59 | Source: Ambulatory Visit | Attending: Obstetrics and Gynecology | Admitting: Obstetrics and Gynecology

## 2018-01-20 DIAGNOSIS — Z1231 Encounter for screening mammogram for malignant neoplasm of breast: Secondary | ICD-10-CM

## 2018-01-21 ENCOUNTER — Other Ambulatory Visit: Payer: Self-pay | Admitting: Obstetrics and Gynecology

## 2018-01-21 DIAGNOSIS — R928 Other abnormal and inconclusive findings on diagnostic imaging of breast: Secondary | ICD-10-CM

## 2018-01-25 ENCOUNTER — Ambulatory Visit
Admission: RE | Admit: 2018-01-25 | Discharge: 2018-01-25 | Disposition: A | Discharge status: Home / Self Care | Payer: 59 | Source: Ambulatory Visit | Attending: Obstetrics and Gynecology | Admitting: Obstetrics and Gynecology

## 2018-01-25 ENCOUNTER — Ambulatory Visit: Payer: 59

## 2018-01-25 DIAGNOSIS — R928 Other abnormal and inconclusive findings on diagnostic imaging of breast: Secondary | ICD-10-CM

## 2019-05-24 ENCOUNTER — Other Ambulatory Visit: Payer: Self-pay | Admitting: Obstetrics and Gynecology

## 2019-05-24 DIAGNOSIS — Z1231 Encounter for screening mammogram for malignant neoplasm of breast: Secondary | ICD-10-CM

## 2019-07-12 ENCOUNTER — Other Ambulatory Visit: Payer: Self-pay

## 2019-07-12 ENCOUNTER — Ambulatory Visit
Admission: RE | Admit: 2019-07-12 | Discharge: 2019-07-12 | Disposition: A | Discharge status: Home / Self Care | Payer: No Typology Code available for payment source | Source: Ambulatory Visit | Attending: Obstetrics and Gynecology | Admitting: Obstetrics and Gynecology

## 2019-07-12 DIAGNOSIS — Z1231 Encounter for screening mammogram for malignant neoplasm of breast: Secondary | ICD-10-CM

## 2020-06-22 ENCOUNTER — Other Ambulatory Visit: Payer: Self-pay | Admitting: Obstetrics and Gynecology

## 2020-06-22 DIAGNOSIS — Z1231 Encounter for screening mammogram for malignant neoplasm of breast: Secondary | ICD-10-CM

## 2020-07-13 ENCOUNTER — Ambulatory Visit
Admission: RE | Admit: 2020-07-13 | Discharge: 2020-07-13 | Disposition: A | Discharge status: Home / Self Care | Payer: No Typology Code available for payment source | Source: Ambulatory Visit | Attending: Obstetrics and Gynecology | Admitting: Obstetrics and Gynecology

## 2020-07-13 ENCOUNTER — Other Ambulatory Visit: Payer: Self-pay

## 2020-07-13 DIAGNOSIS — Z1231 Encounter for screening mammogram for malignant neoplasm of breast: Secondary | ICD-10-CM

## 2020-07-18 ENCOUNTER — Other Ambulatory Visit: Payer: Self-pay | Admitting: Obstetrics and Gynecology

## 2020-07-18 DIAGNOSIS — R928 Other abnormal and inconclusive findings on diagnostic imaging of breast: Secondary | ICD-10-CM

## 2020-08-02 ENCOUNTER — Ambulatory Visit
Admission: RE | Admit: 2020-08-02 | Discharge: 2020-08-02 | Disposition: A | Discharge status: Home / Self Care | Payer: 59 | Source: Ambulatory Visit | Attending: Obstetrics and Gynecology | Admitting: Obstetrics and Gynecology

## 2020-08-02 ENCOUNTER — Other Ambulatory Visit: Payer: Self-pay

## 2020-08-02 DIAGNOSIS — R928 Other abnormal and inconclusive findings on diagnostic imaging of breast: Secondary | ICD-10-CM

## 2021-06-19 ENCOUNTER — Other Ambulatory Visit: Payer: Self-pay | Admitting: Obstetrics and Gynecology

## 2021-06-19 DIAGNOSIS — Z1231 Encounter for screening mammogram for malignant neoplasm of breast: Secondary | ICD-10-CM

## 2021-08-06 ENCOUNTER — Other Ambulatory Visit: Payer: Self-pay | Admitting: Obstetrics and Gynecology

## 2021-08-06 ENCOUNTER — Ambulatory Visit
Admission: RE | Admit: 2021-08-06 | Discharge: 2021-08-06 | Disposition: A | Discharge status: Home / Self Care | Payer: 59 | Source: Ambulatory Visit | Attending: Obstetrics and Gynecology | Admitting: Obstetrics and Gynecology

## 2021-08-06 DIAGNOSIS — R928 Other abnormal and inconclusive findings on diagnostic imaging of breast: Secondary | ICD-10-CM

## 2021-08-06 DIAGNOSIS — Z1231 Encounter for screening mammogram for malignant neoplasm of breast: Secondary | ICD-10-CM

## 2021-08-07 ENCOUNTER — Ambulatory Visit
Admission: RE | Admit: 2021-08-07 | Discharge: 2021-08-07 | Disposition: A | Discharge status: Home / Self Care | Payer: 59 | Source: Ambulatory Visit | Attending: Obstetrics and Gynecology | Admitting: Obstetrics and Gynecology

## 2021-08-07 ENCOUNTER — Other Ambulatory Visit: Payer: Self-pay

## 2021-08-07 DIAGNOSIS — R928 Other abnormal and inconclusive findings on diagnostic imaging of breast: Secondary | ICD-10-CM

## 2021-09-11 ENCOUNTER — Other Ambulatory Visit: Payer: 59

## 2021-11-17 IMAGING — MG DIGITAL SCREENING BILAT W/ TOMO W/ CAD
8 series · 9 of 24 positions shown · non-contrast
Comparison: Previous exams.

CLINICAL DATA: Screening.

EXAM:
DIGITAL SCREENING BILATERAL MAMMOGRAM WITH TOMO AND CAD

[L MLO synth-2D]
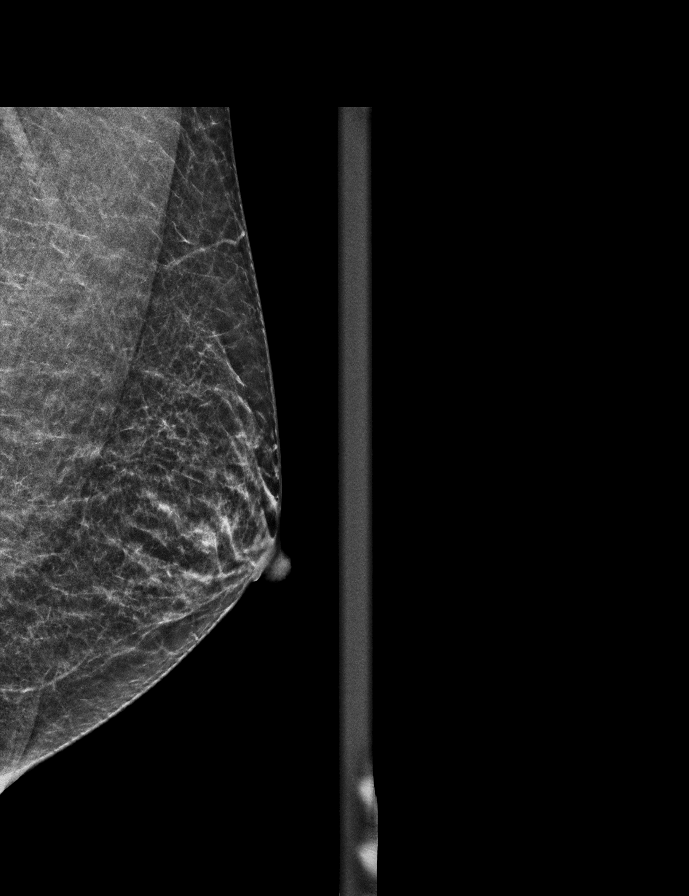

[R MLO synth-2D]
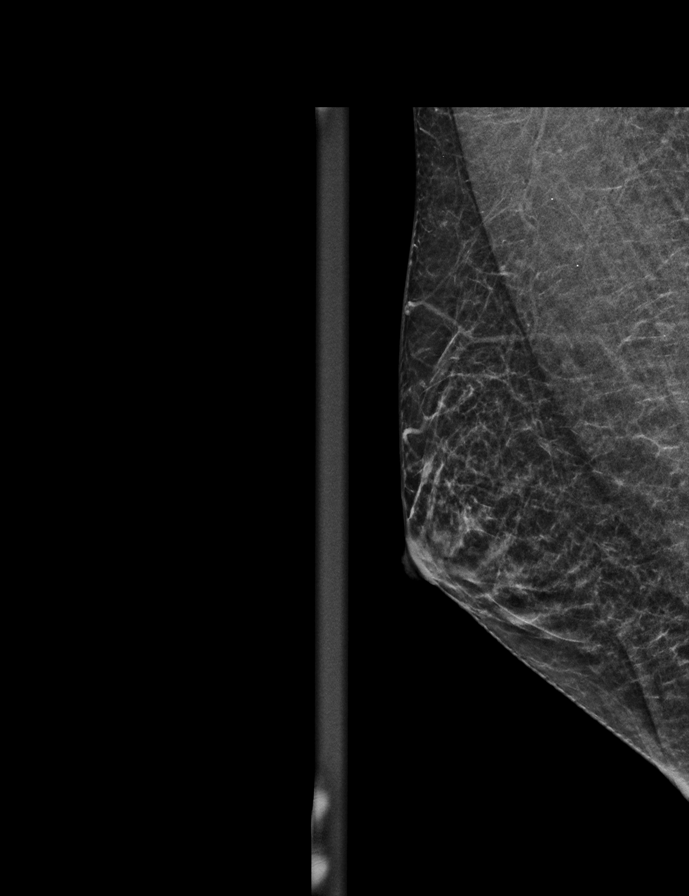

[R CC synth-2D]
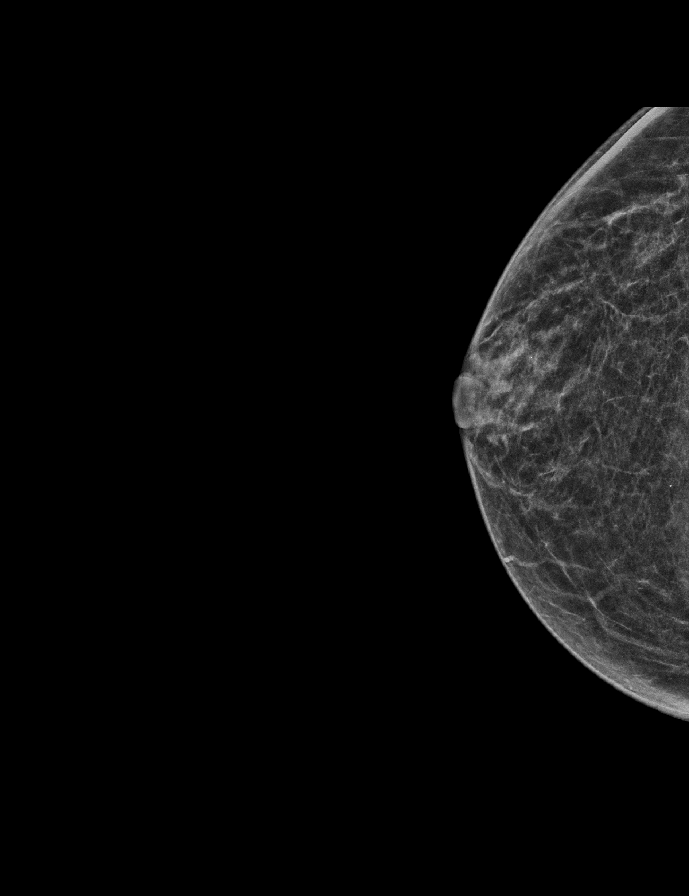

[L CC synth-2D]
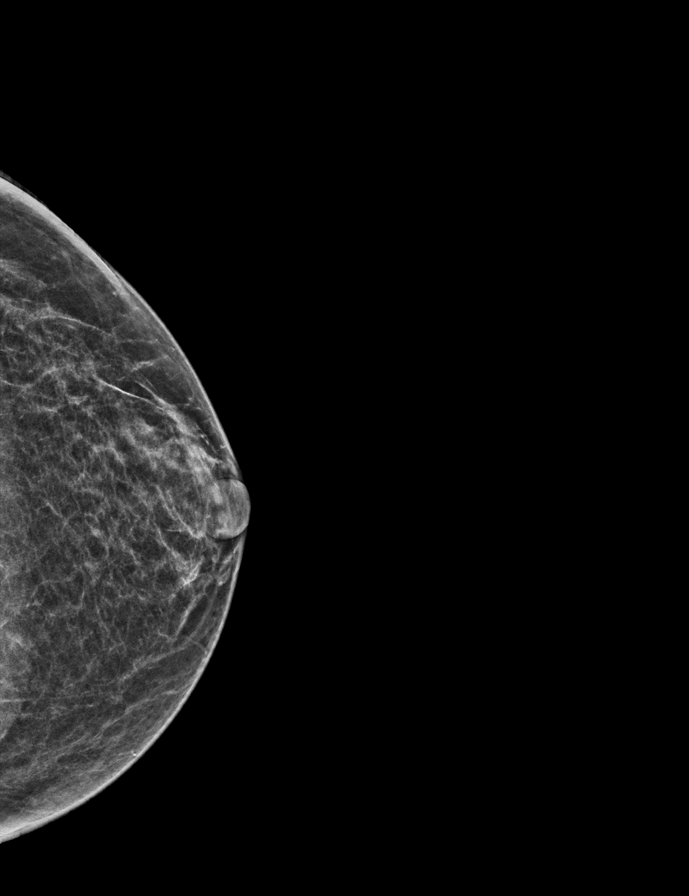

[R MLO tomo · 2 of 39 frames shown]
[frame 13/39]
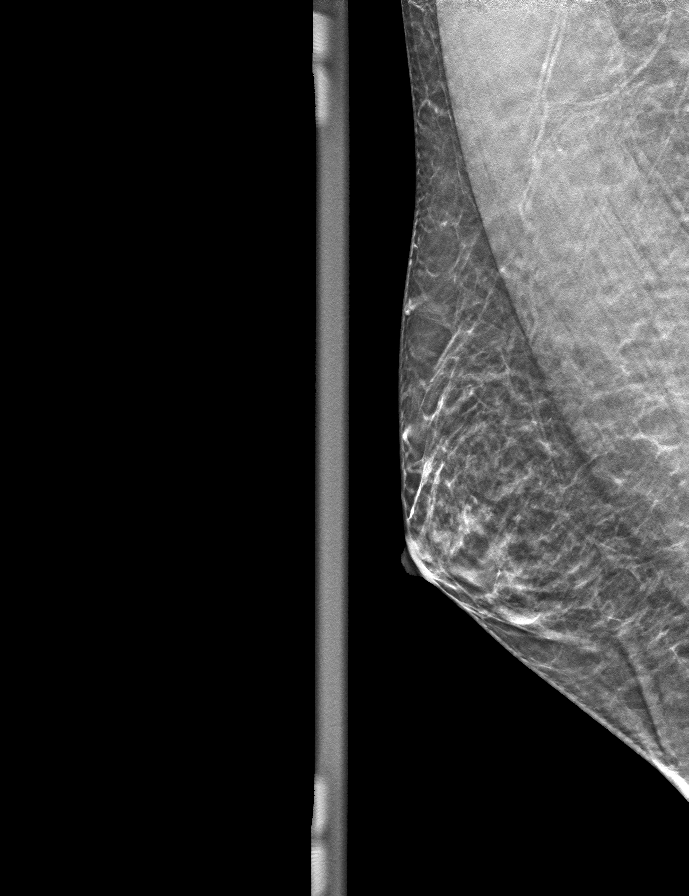
[frame 20/39]
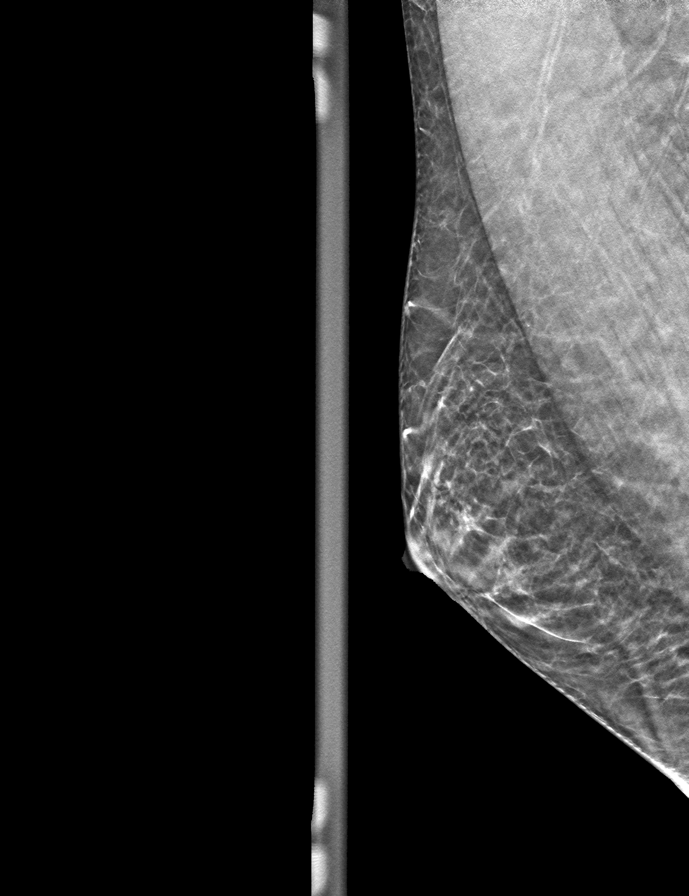

[R CC tomo · tomo slice 21/41.0]
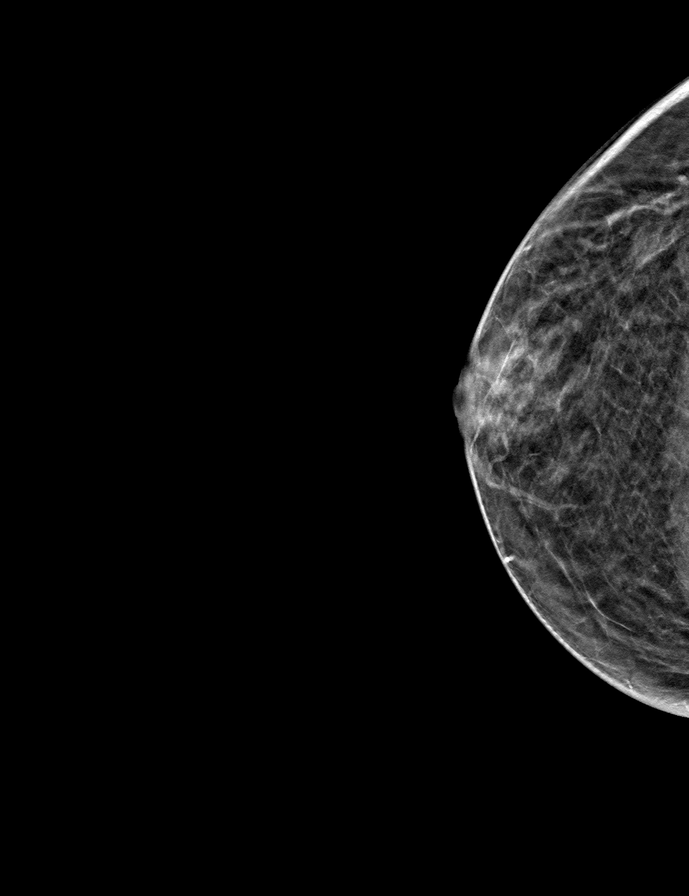

[L MLO tomo · tomo slice 21/40.0]
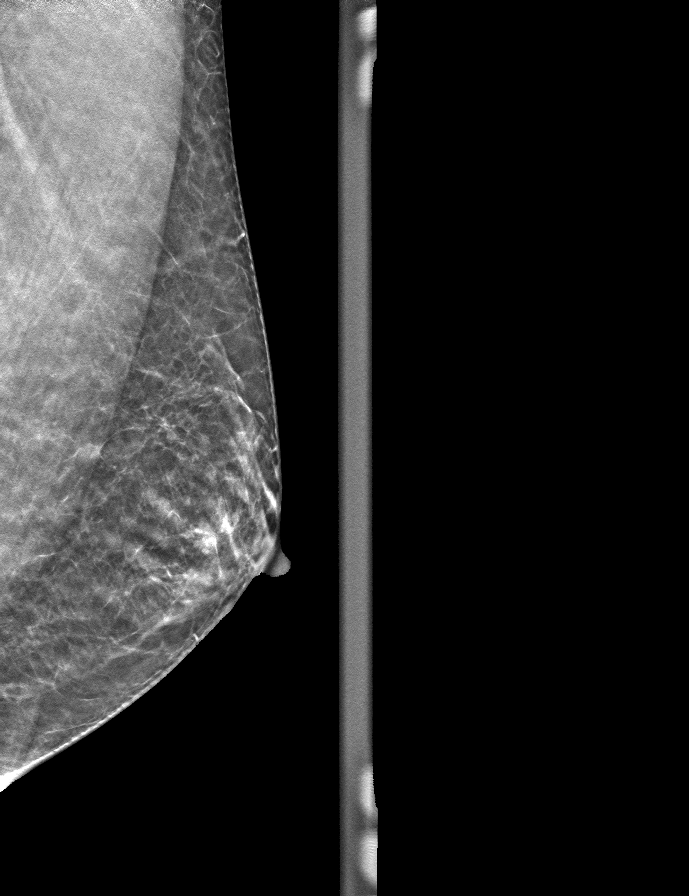

[L CC tomo · tomo slice 21/41.0]
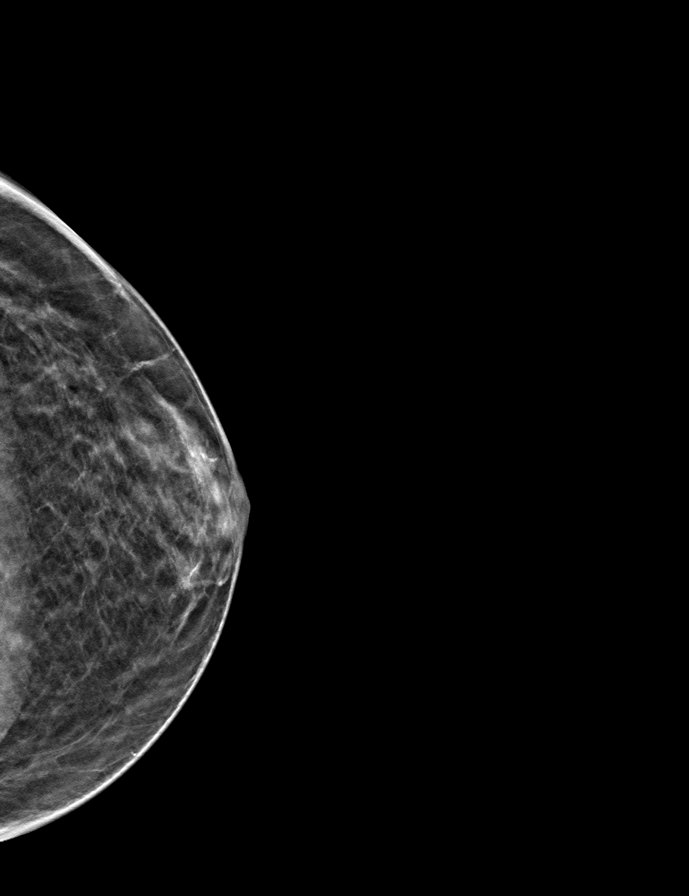

[9 of 24 positions shown; findings below may reference images not displayed]

ACR Breast Density Category b: There are scattered areas of
fibroglandular density.
FINDINGS: In the left breast, a possible asymmetry warrants further
evaluation. In the right breast, no findings suspicious for
malignancy. Images were processed with CAD.
IMPRESSION: Further evaluation is suggested for possible asymmetry in the left
breast.

RECOMMENDATION:
Diagnostic mammogram and possibly ultrasound of the left breast.
(Code:[id number])

The patient will be contacted regarding the findings, and additional
imaging will be scheduled.

BI-RADS CATEGORY  0: Incomplete. Need additional imaging evaluation
and/or prior mammograms for comparison.

## 2021-12-07 IMAGING — US US BREAST*L* LIMITED INC AXILLA
1 series · 8 of 8 positions shown · non-contrast
Comparison: Previous exam(s).

CLINICAL DATA: The patient was called back for a left breast focal
asymmetry.

EXAM:
DIGITAL DIAGNOSTIC LEFT MAMMOGRAM WITH TOMO
ULTRASOUND LEFT BREAST

[Series 1: us breast*left* limited inc axilla · 0.05mm/px · 8 of 8 slices shown]
[im 1/8]
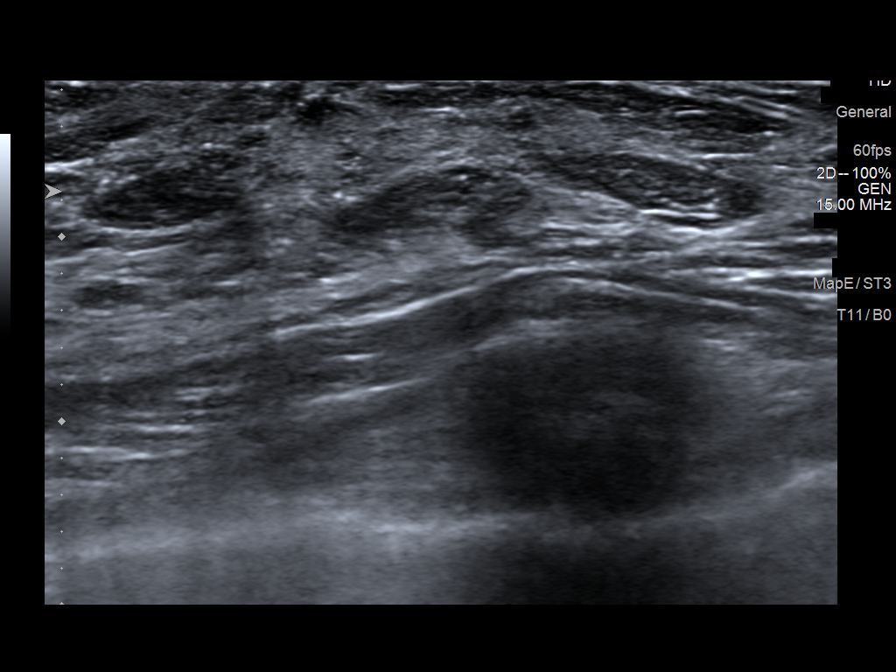
[im 2/8]
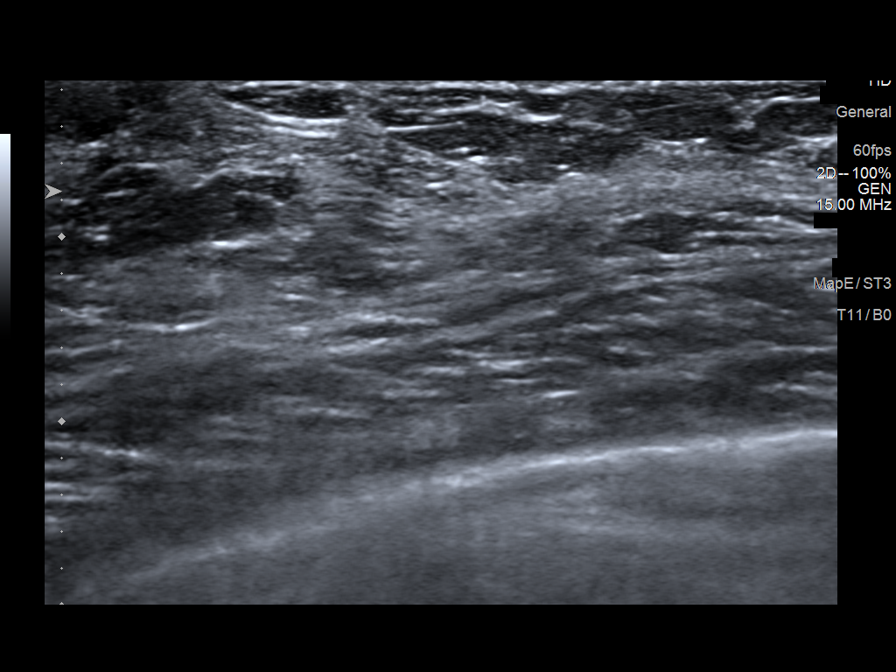
[im 3/8]
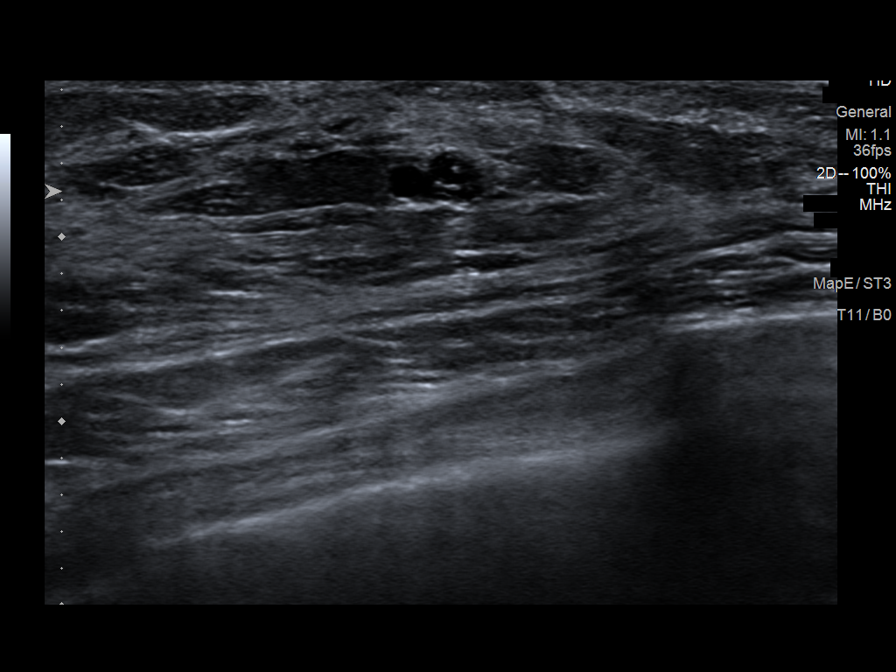
[im 4/8]
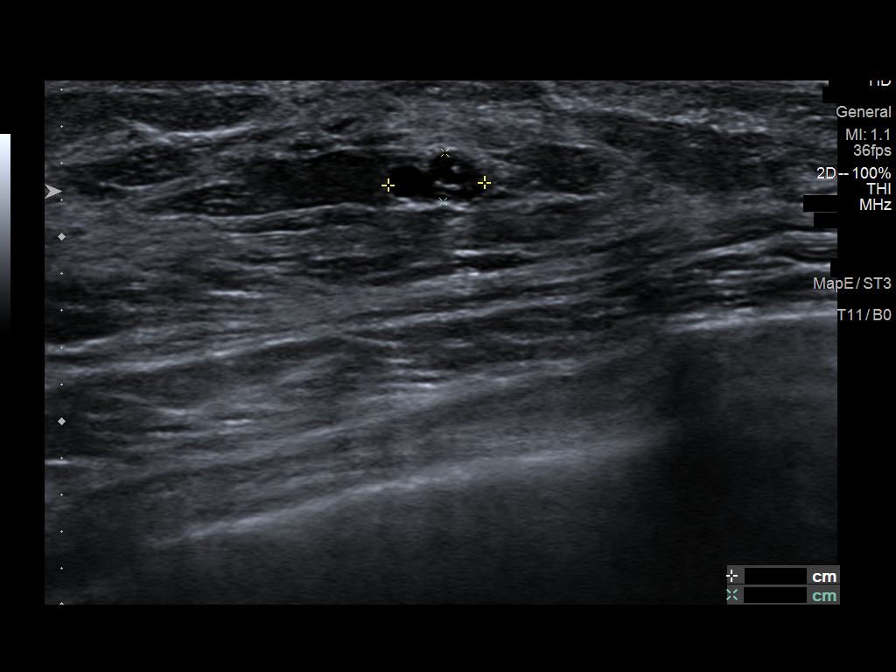
[im 5/8]
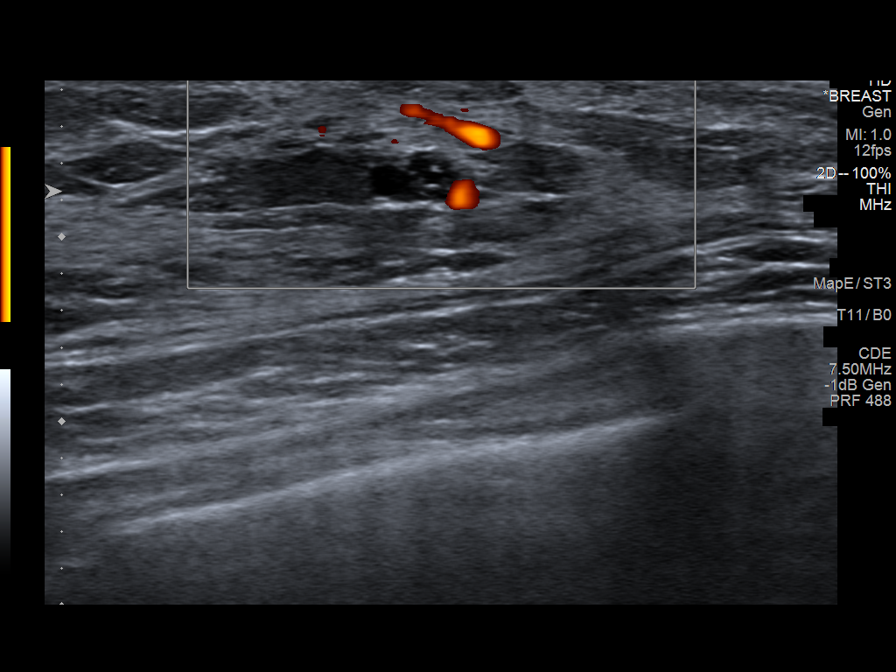
[im 6/8]
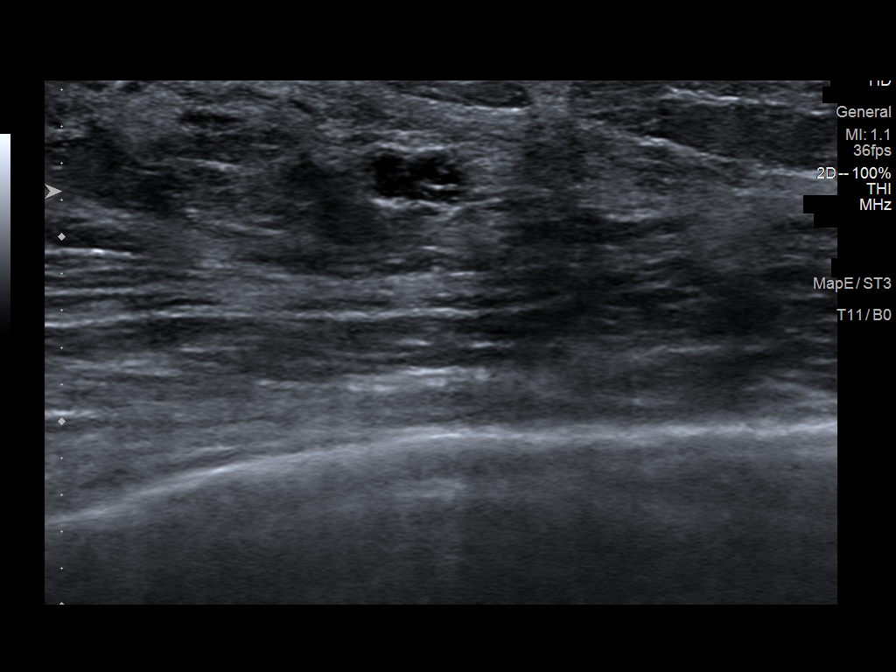
[im 7/8]
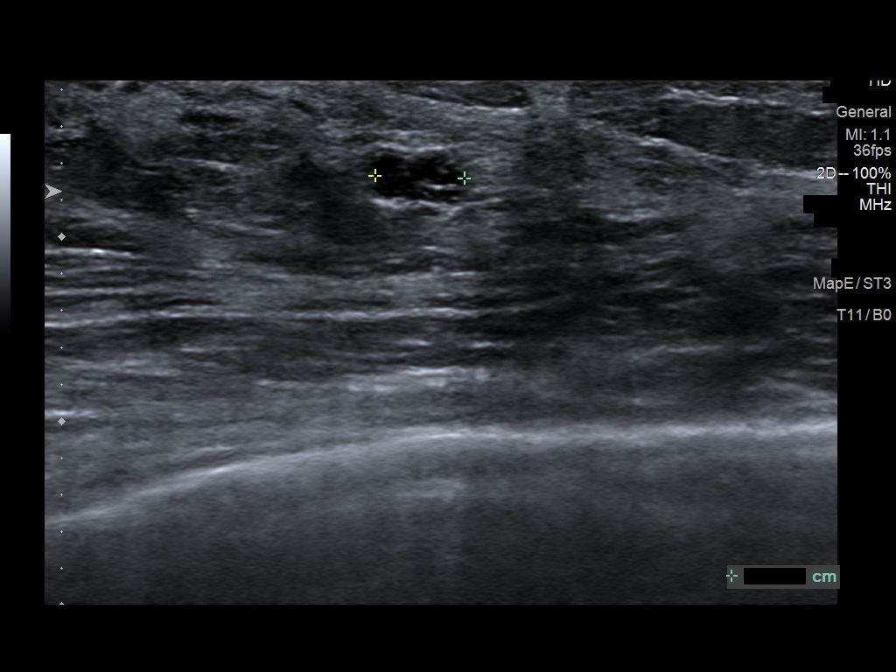
[im 8/8]
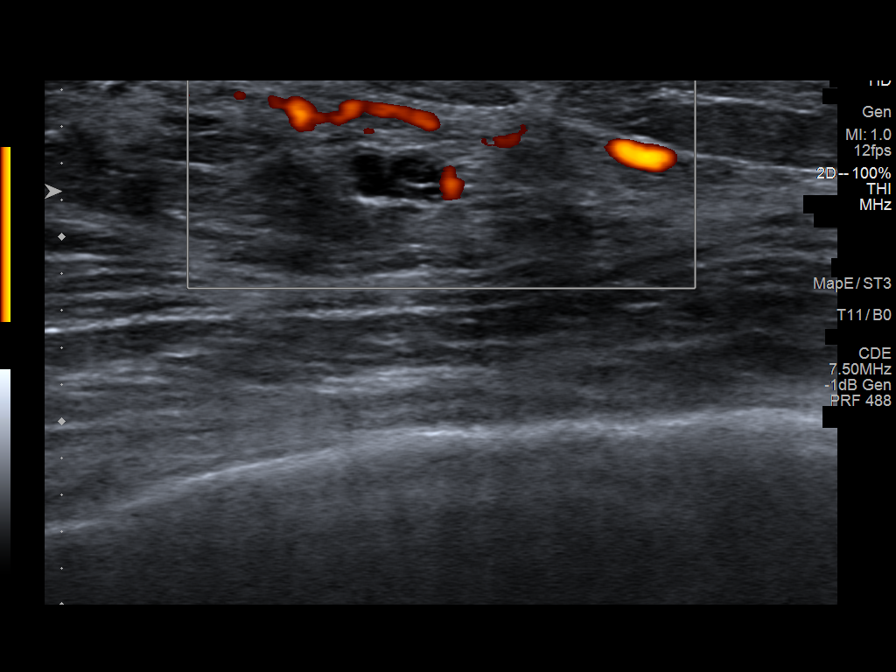

[8 of 8 positions shown; findings below may reference images not displayed]

ACR Breast Density Category b: There are scattered areas of
fibroglandular density.
FINDINGS: The left breast focal asymmetry largely resolves on additional
imaging.

On physical exam, no suspicious lumps are identified.

Targeted ultrasound is performed, showing a small cluster cyst and a
dense island of glandular tissue accounting for the mammographically
identified focal asymmetry. No evidence of malignancy.
IMPRESSION: No mammographic or sonographic evidence of malignancy.

RECOMMENDATION:
Annual screening mammography.

I have discussed the findings and recommendations with the patient.
If applicable, a reminder letter will be sent to the patient
regarding the next appointment.

BI-RADS CATEGORY  2: Benign.

## 2022-08-12 ENCOUNTER — Other Ambulatory Visit: Payer: Self-pay | Admitting: Obstetrics and Gynecology

## 2022-08-12 DIAGNOSIS — Z1231 Encounter for screening mammogram for malignant neoplasm of breast: Secondary | ICD-10-CM

## 2022-10-03 ENCOUNTER — Ambulatory Visit
Admission: RE | Admit: 2022-10-03 | Discharge: 2022-10-03 | Disposition: A | Discharge status: Home / Self Care | Payer: 59 | Source: Ambulatory Visit | Attending: Obstetrics and Gynecology | Admitting: Obstetrics and Gynecology

## 2022-10-03 DIAGNOSIS — Z1231 Encounter for screening mammogram for malignant neoplasm of breast: Secondary | ICD-10-CM

## 2022-10-13 ENCOUNTER — Other Ambulatory Visit: Payer: Self-pay | Admitting: Obstetrics and Gynecology

## 2022-10-13 DIAGNOSIS — N6320 Unspecified lump in the left breast, unspecified quadrant: Secondary | ICD-10-CM

## 2022-10-13 DIAGNOSIS — N644 Mastodynia: Secondary | ICD-10-CM

## 2022-11-01 ENCOUNTER — Ambulatory Visit
Admission: RE | Admit: 2022-11-01 | Discharge: 2022-11-01 | Disposition: A | Discharge status: Home / Self Care | Payer: 59 | Source: Ambulatory Visit | Attending: Obstetrics and Gynecology | Admitting: Obstetrics and Gynecology

## 2022-11-01 ENCOUNTER — Other Ambulatory Visit: Payer: Self-pay | Admitting: Obstetrics and Gynecology

## 2022-11-01 DIAGNOSIS — N6320 Unspecified lump in the left breast, unspecified quadrant: Secondary | ICD-10-CM

## 2022-11-01 DIAGNOSIS — N6310 Unspecified lump in the right breast, unspecified quadrant: Secondary | ICD-10-CM

## 2022-11-01 DIAGNOSIS — N644 Mastodynia: Secondary | ICD-10-CM

## 2024-03-02 ENCOUNTER — Other Ambulatory Visit: Payer: Self-pay | Admitting: Obstetrics and Gynecology

## 2024-03-02 DIAGNOSIS — Z1231 Encounter for screening mammogram for malignant neoplasm of breast: Secondary | ICD-10-CM

## 2024-03-16 ENCOUNTER — Ambulatory Visit

## 2024-05-02 ENCOUNTER — Ambulatory Visit
Admission: RE | Admit: 2024-05-02 | Discharge: 2024-05-02 | Disposition: A | Discharge status: Home / Self Care | Source: Ambulatory Visit | Attending: Obstetrics and Gynecology | Admitting: Obstetrics and Gynecology

## 2024-05-02 DIAGNOSIS — Z1231 Encounter for screening mammogram for malignant neoplasm of breast: Secondary | ICD-10-CM

## 2024-05-05 ENCOUNTER — Other Ambulatory Visit: Payer: Self-pay | Admitting: Obstetrics and Gynecology

## 2024-05-05 DIAGNOSIS — R928 Other abnormal and inconclusive findings on diagnostic imaging of breast: Secondary | ICD-10-CM

## 2024-06-02 ENCOUNTER — Ambulatory Visit
Admission: RE | Admit: 2024-06-02 | Discharge: 2024-06-02 | Disposition: A | Source: Ambulatory Visit | Attending: Obstetrics and Gynecology | Admitting: Obstetrics and Gynecology

## 2024-06-02 ENCOUNTER — Ambulatory Visit
Admission: RE | Admit: 2024-06-02 | Discharge: 2024-06-02 | Disposition: A | Discharge status: Home / Self Care | Source: Ambulatory Visit | Attending: Obstetrics and Gynecology | Admitting: Obstetrics and Gynecology

## 2024-06-02 DIAGNOSIS — R928 Other abnormal and inconclusive findings on diagnostic imaging of breast: Secondary | ICD-10-CM

## 2024-07-29 ENCOUNTER — Emergency Department (HOSPITAL_BASED_OUTPATIENT_CLINIC_OR_DEPARTMENT_OTHER)
Admission: EM | Admit: 2024-07-29 | Discharge: 2024-07-29 | Disposition: A | Discharge status: Home / Self Care | Attending: Emergency Medicine | Admitting: Emergency Medicine

## 2024-07-29 ENCOUNTER — Other Ambulatory Visit: Payer: Self-pay

## 2024-07-29 ENCOUNTER — Encounter (HOSPITAL_BASED_OUTPATIENT_CLINIC_OR_DEPARTMENT_OTHER): Payer: Self-pay

## 2024-07-29 DIAGNOSIS — T7840XA Allergy, unspecified, initial encounter: Secondary | ICD-10-CM | POA: Diagnosis present

## 2024-07-29 DIAGNOSIS — F419 Anxiety disorder, unspecified: Secondary | ICD-10-CM | POA: Diagnosis not present

## 2024-07-29 DIAGNOSIS — L299 Pruritus, unspecified: Secondary | ICD-10-CM | POA: Diagnosis not present

## 2024-07-29 DIAGNOSIS — R0989 Other specified symptoms and signs involving the circulatory and respiratory systems: Secondary | ICD-10-CM

## 2024-07-29 LAB — CBC WITH DIFFERENTIAL/PLATELET
Abs Immature Granulocytes: 0.01 K/uL (ref 0.00–0.07)
Basophils Absolute: 0.1 K/uL (ref 0.0–0.1)
Basophils Relative: 1 %
Eosinophils Absolute: 0.2 K/uL (ref 0.0–0.5)
Eosinophils Relative: 3 %
HCT: 38.8 % (ref 36.0–46.0)
Hemoglobin: 12.9 g/dL (ref 12.0–15.0)
Immature Granulocytes: 0 %
Lymphocytes Relative: 36 %
Lymphs Abs: 2.6 K/uL (ref 0.7–4.0)
MCH: 31.2 pg (ref 26.0–34.0)
MCHC: 33.2 g/dL (ref 30.0–36.0)
MCV: 93.9 fL (ref 80.0–100.0)
Monocytes Absolute: 0.6 K/uL (ref 0.1–1.0)
Monocytes Relative: 8 %
Neutro Abs: 3.9 K/uL (ref 1.7–7.7)
Neutrophils Relative %: 52 %
Platelets: 276 K/uL (ref 150–400)
RBC: 4.13 MIL/uL (ref 3.87–5.11)
RDW: 11.7 % (ref 11.5–15.5)
WBC: 7.3 K/uL (ref 4.0–10.5)
nRBC: 0 % (ref 0.0–0.2)

## 2024-07-29 LAB — COMPREHENSIVE METABOLIC PANEL WITH GFR
ALT: 16 U/L (ref 0–44)
AST: 17 U/L (ref 15–41)
Albumin: 4.6 g/dL (ref 3.5–5.0)
Alkaline Phosphatase: 70 U/L (ref 38–126)
Anion gap: 12 (ref 5–15)
BUN: 13 mg/dL (ref 6–20)
CO2: 25 mmol/L (ref 22–32)
Calcium: 9.1 mg/dL (ref 8.9–10.3)
Chloride: 105 mmol/L (ref 98–111)
Creatinine, Ser: 0.87 mg/dL (ref 0.44–1.00)
GFR, Estimated: >60 mL/min
Glucose, Bld: 91 mg/dL (ref 70–99)
Potassium: 4.1 mmol/L (ref 3.5–5.1)
Sodium: 142 mmol/L (ref 135–145)
Total Bilirubin: 0.4 mg/dL (ref 0.0–1.2)
Total Protein: 7 g/dL (ref 6.5–8.1)

## 2024-07-29 LAB — URINE DRUG SCREEN
Amphetamines: NEGATIVE
Barbiturates: NEGATIVE
Benzodiazepines: NEGATIVE
Cocaine: NEGATIVE
Fentanyl: NEGATIVE
Methadone Scn, Ur: NEGATIVE
Opiates: NEGATIVE
Tetrahydrocannabinol: NEGATIVE

## 2024-07-29 LAB — ETHANOL: Alcohol, Ethyl (B): <15 mg/dL

## 2024-07-29 LAB — PREGNANCY, URINE: Preg Test, Ur: NEGATIVE

## 2024-07-29 MED ORDER — PREDNISONE 10 MG PO TABS: 30.0000 mg | ORAL_TABLET | Freq: Every day | ORAL | 0 refills | Status: AC

## 2024-07-29 MED ORDER — METHYLPREDNISOLONE SODIUM SUCC 125 MG IJ SOLR
125.0000 mg | Freq: Once | INTRAMUSCULAR | Status: AC
  Administered 2024-07-29: 125 mg via INTRAVENOUS
  Filled 2024-07-29: qty 2

## 2024-07-29 NOTE — ED Triage Notes (Signed)
 Patient reports while shopping she had sudden onset throat irritation,  She later ate a pretzel and was able to keep it down but then the throat irritation worsened. Patient states that she then started having generalized itching all over, and feels unsteady, or high. She states that she took over the counter benadryl  and it has not helped. ER PA, denies new foods, or any new products.

## 2024-07-29 NOTE — ED Provider Notes (Signed)
 " [place] EMERGENCY DEPARTMENT AT [facility] [place] Provider Note   CSN: [id number] Arrival date & time: 07/29/24  8158     Patient presents with: Allergic Reaction   Alicia Barber is a 48 y.o. female.   HPI     48yo female with history of ADD presents with concern for throat itching followed by body itching.  340PM first dose of benadryl , then took Was ok, then in [place] suddenly had sudden right throat itching, tearing of right eye Then started to feel lightheaded and sort of high Then ate a sample pretzel, then developed whole throat itching Felt sort of high Nausea, no vomiting Got to the car then itching started 30 minutes later  Felt sensation of being drunk, then itching started and severe itching all over, head, scalp Itching seems to be a little better after changing clothes Took a diphenydramine when at [employer], took it there, then itching Taken 100mg  of benadryl   Throat feels a little better than before, dry, a little weird Denies SI, denies etoh, drug use  No shortness ov breath, no vomiting or diarrhea Ipratropium nasal prn No fever or recent illness Stress no sick symptoms  Methylphenidate 72mg  didn't take afternoon dose  fexofenadine Finasteride  B12 Multivitamin   MRI contrast allergy no other allergy   On chart review, stated she has had similar episode in the past and seen her primary care doctor.  She had an MRA and MRI brain which showed no acute abnormalities.  Past Medical History:  Diagnosis Date   Abnormal Pap smear 07/2010   repeat was negative   GBS (group B Streptococcus carrier), +RV culture, currently pregnant    H/O candidiasis    Hx of varicella    Yeast infection     Prior to Admission medications  Medication Sig Start Date End Date Taking? Authorizing Provider  predniSONE  (DELTASONE ) 10 MG tablet Take 3 tablets (30 mg total) by mouth daily for 2 days. 07/29/24 07/31/24 Yes Dreama Longs, MD  ibuprofen   (ADVIL ,MOTRIN ) 600 MG tablet Take 1 tablet (600 mg total) by mouth every 6 (six) hours. 06/02/14   Standard, Venus, CNM  loratadine (CLARITIN) 10 MG tablet Take 10 mg by mouth daily as needed for allergies.    [provider]  Prenatal Vit-Fe Fumarate-FA (PRENATAL MULTIVITAMIN) TABS Take 1 tablet by mouth daily. 01/20/12   Genetta Shuck, CNM    Allergies: Patient has no known allergies.    Review of Systems  Updated Vital Signs BP 115/75   Pulse 62   Temp 98.1 F (36.7 C)   Resp 17   Ht 5' 8 (1.727 m)   Wt 62.4 kg   SpO2 100%   BMI 20.92 kg/m   Physical Exam Vitals and nursing note reviewed.  Constitutional:      General: She is not in acute distress.    Appearance: She is well-developed. She is not diaphoretic.  HENT:     Head: Normocephalic and atraumatic.  Eyes:     Conjunctiva/sclera: Conjunctivae normal.  Cardiovascular:     Rate and Rhythm: Normal rate and regular rhythm.     Heart sounds: Normal heart sounds. No murmur heard.    No friction rub. No gallop.  Pulmonary:     Effort: Pulmonary effort is normal. No respiratory distress.     Breath sounds: Normal breath sounds. No wheezing or rales.  Abdominal:     General: There is no distension.     Palpations: Abdomen is soft.  Tenderness: There is no abdominal tenderness. There is no guarding.  Musculoskeletal:        General: No tenderness.     Cervical back: Normal range of motion.  Skin:    General: Skin is warm and dry.     Findings: No erythema or rash.  Neurological:     Mental Status: She is alert and oriented to person, place, and time.  Psychiatric:        Mood and Affect: Mood is anxious.        Speech: Speech is rapid and pressured.        Thought Content: Thought content does not include suicidal ideation. Thought content does not include suicidal plan.     (all labs ordered are listed, but only abnormal results are displayed) Labs Reviewed  CBC WITH DIFFERENTIAL/PLATELET   COMPREHENSIVE METABOLIC PANEL WITH GFR  URINE DRUG SCREEN  ETHANOL  PREGNANCY, URINE    EKG: EKG Interpretation Date/Time:  Friday July 29 2024 20:04:31 EST Ventricular Rate:  73 PR Interval:  114 QRS Duration:  93 QT Interval:  399 QTC Calculation: 440 R Axis:   86  Text Interpretation: Sinus rhythm Paired ventricular premature complexes Borderline short PR interval No previous ECGs available Confirmed by Dreama Longs ([id number]) on 07/30/2024 11:53:24 AM  Radiology: No results found.   Procedures   Medications Ordered in the ED  methylPREDNISolone  sodium succinate (SOLU-MEDROL ) 125 mg/2 mL injection 125 mg (125 mg Intravenous Given 07/29/24 2027)                                      48yo female with history of ADD presents with concern for throat itching followed by body itching.  Presenting with pressured speech, itching, anxiety and sensation of throat itching.  Throat itching improving--no tongue, throat swelling, abdominal pain, hives, or signs of anaphylaxis on my exam at this time.  Given her significant itching and acute onset of itching and throat symptoms, did order a dose of IV Solu-Medrol .  Given her other sensation of being off, the sensation that she felt like she was drugged, ordered EKG and labs.  Her vital signs do not suggest a thyroid emergency.  EKG shows a normal sinus rhythm.  Labs showed no clinically significant anemia, electrolyte abnormalities, negative pregnancy test, UDS is negative and alcohol level is also negative.  She is feeling improved on reevaluation.  Suspect likely allergic reaction, also considered possibility of a stress reaction.  Unclear allergic trigger.  Will give a prescription for steroids for the next 2 days, recommend follow-up with her primary care physician.        Final diagnoses:  Allergic reaction, initial encounter  Pruritus  Itching sensation of throat    ED Discharge Orders          Ordered     predniSONE  (DELTASONE ) 10 MG tablet  Daily        07/29/24 2143               Dreama Longs, MD 07/30/24 1157  "

## 2024-08-20 ENCOUNTER — Ambulatory Visit (HOSPITAL_COMMUNITY)
Admission: EM | Admit: 2024-08-20 | Discharge: 2024-08-22 | Disposition: A | Discharge status: Other Acute Inpatient Hospital | Attending: Psychiatry | Admitting: Psychiatry

## 2024-08-20 DIAGNOSIS — F39 Unspecified mood [affective] disorder: Secondary | ICD-10-CM | POA: Diagnosis not present

## 2024-08-20 DIAGNOSIS — F603 Borderline personality disorder: Secondary | ICD-10-CM | POA: Insufficient documentation

## 2024-08-20 DIAGNOSIS — Z560 Unemployment, unspecified: Secondary | ICD-10-CM | POA: Diagnosis not present

## 2024-08-20 DIAGNOSIS — Z635 Disruption of family by separation and divorce: Secondary | ICD-10-CM | POA: Diagnosis not present

## 2024-08-20 DIAGNOSIS — F4310 Post-traumatic stress disorder, unspecified: Secondary | ICD-10-CM | POA: Insufficient documentation

## 2024-08-20 DIAGNOSIS — Z653 Problems related to other legal circumstances: Secondary | ICD-10-CM | POA: Diagnosis not present

## 2024-08-20 DIAGNOSIS — F309 Manic episode, unspecified: Secondary | ICD-10-CM | POA: Diagnosis not present

## 2024-08-20 DIAGNOSIS — F909 Attention-deficit hyperactivity disorder, unspecified type: Secondary | ICD-10-CM | POA: Insufficient documentation

## 2024-08-20 DIAGNOSIS — Z79899 Other long term (current) drug therapy: Secondary | ICD-10-CM | POA: Diagnosis not present

## 2024-08-20 LAB — CBC WITH DIFFERENTIAL/PLATELET
Abs Immature Granulocytes: 0.02 K/uL (ref 0.00–0.07)
Basophils Absolute: 0.1 K/uL (ref 0.0–0.1)
Basophils Relative: 1 %
Eosinophils Absolute: 0.1 K/uL (ref 0.0–0.5)
Eosinophils Relative: 1 %
HCT: 40.8 % (ref 36.0–46.0)
Hemoglobin: 13.3 g/dL (ref 12.0–15.0)
Immature Granulocytes: 0 %
Lymphocytes Relative: 18 %
Lymphs Abs: 1.9 K/uL (ref 0.7–4.0)
MCH: 31.2 pg (ref 26.0–34.0)
MCHC: 32.6 g/dL (ref 30.0–36.0)
MCV: 95.8 fL (ref 80.0–100.0)
Monocytes Absolute: 0.5 K/uL (ref 0.1–1.0)
Monocytes Relative: 5 %
Neutro Abs: 7.7 K/uL (ref 1.7–7.7)
Neutrophils Relative %: 75 %
Platelets: 260 K/uL (ref 150–400)
RBC: 4.26 MIL/uL (ref 3.87–5.11)
RDW: 11.5 % (ref 11.5–15.5)
WBC: 10.3 K/uL (ref 4.0–10.5)
nRBC: 0 % (ref 0.0–0.2)

## 2024-08-20 LAB — URINALYSIS, ROUTINE W REFLEX MICROSCOPIC
Bilirubin Urine: NEGATIVE
Glucose, UA: NEGATIVE mg/dL
Hgb urine dipstick: NEGATIVE
Ketones, ur: NEGATIVE mg/dL
Leukocytes,Ua: NEGATIVE
Nitrite: NEGATIVE
Protein, ur: NEGATIVE mg/dL
Specific Gravity, Urine: 1.004 — ABNORMAL LOW (ref 1.005–1.030)
pH: 6 (ref 5.0–8.0)

## 2024-08-20 LAB — ETHANOL: Alcohol, Ethyl (B): <15 mg/dL

## 2024-08-20 LAB — POCT URINE DRUG SCREEN - MANUAL ENTRY (I-SCREEN)
POC Amphetamine UR: NOT DETECTED
POC Buprenorphine (BUP): NOT DETECTED
POC Cocaine UR: NOT DETECTED
POC Marijuana UR: NOT DETECTED
POC Methadone UR: NOT DETECTED
POC Methamphetamine UR: NOT DETECTED
POC Morphine: NOT DETECTED
POC Oxazepam (BZO): NOT DETECTED
POC Oxycodone UR: NOT DETECTED
POC Secobarbital (BAR): NOT DETECTED

## 2024-08-20 LAB — COMPREHENSIVE METABOLIC PANEL WITH GFR
ALT: 16 U/L (ref 0–44)
AST: 23 U/L (ref 15–41)
Albumin: 4.5 g/dL (ref 3.5–5.0)
Alkaline Phosphatase: 72 U/L (ref 38–126)
Anion gap: 12 (ref 5–15)
BUN: 7 mg/dL (ref 6–20)
CO2: 25 mmol/L (ref 22–32)
Calcium: 9.5 mg/dL (ref 8.9–10.3)
Chloride: 103 mmol/L (ref 98–111)
Creatinine, Ser: 0.81 mg/dL (ref 0.44–1.00)
GFR, Estimated: >60 mL/min
Glucose, Bld: 91 mg/dL (ref 70–99)
Potassium: 4.2 mmol/L (ref 3.5–5.1)
Sodium: 140 mmol/L (ref 135–145)
Total Bilirubin: 0.5 mg/dL (ref 0.0–1.2)
Total Protein: 7.3 g/dL (ref 6.5–8.1)

## 2024-08-20 LAB — VITAMIN B12: Vitamin B-12: 840 pg/mL (ref 180–914)

## 2024-08-20 LAB — VITAMIN D 25 HYDROXY (VIT D DEFICIENCY, FRACTURES): Vit D, 25-Hydroxy: 73 ng/mL (ref 30–100)

## 2024-08-20 LAB — TSH: TSH: 0.679 u[IU]/mL (ref 0.350–4.500)

## 2024-08-20 LAB — MAGNESIUM: Magnesium: 2.2 mg/dL (ref 1.7–2.4)

## 2024-08-20 LAB — POC URINE PREG, ED: Preg Test, Ur: NEGATIVE

## 2024-08-20 MED ORDER — DIPHENHYDRAMINE HCL 50 MG/ML IJ SOLN: 50.0000 mg | Freq: Three times a day (TID) | INTRAMUSCULAR | Status: DC | PRN

## 2024-08-20 MED ORDER — LORAZEPAM 2 MG/ML IJ SOLN: 2.0000 mg | Freq: Three times a day (TID) | INTRAMUSCULAR | Status: DC | PRN

## 2024-08-20 MED ORDER — ALUM & MAG HYDROXIDE-SIMETH 200-200-20 MG/5ML PO SUSP: 30.0000 mL | ORAL | Status: DC | PRN

## 2024-08-20 MED ORDER — DIPHENHYDRAMINE HCL 50 MG PO CAPS: 50.0000 mg | ORAL_CAPSULE | Freq: Three times a day (TID) | ORAL | Status: DC | PRN

## 2024-08-20 MED ORDER — ACETAMINOPHEN 325 MG PO TABS
650.0000 mg | ORAL_TABLET | Freq: Four times a day (QID) | ORAL | Status: DC | PRN
  Administered 2024-08-21: 650 mg via ORAL
  Filled 2024-08-20: qty 2

## 2024-08-20 MED ORDER — FINASTERIDE 5 MG PO TABS
5.0000 mg | ORAL_TABLET | Freq: Every day | ORAL | Status: DC
  Filled 2024-08-20: qty 1

## 2024-08-20 MED ORDER — HALOPERIDOL LACTATE 5 MG/ML IJ SOLN: 10.0000 mg | Freq: Three times a day (TID) | INTRAMUSCULAR | Status: DC | PRN

## 2024-08-20 MED ORDER — HALOPERIDOL LACTATE 5 MG/ML IJ SOLN: 5.0000 mg | Freq: Three times a day (TID) | INTRAMUSCULAR | Status: DC | PRN

## 2024-08-20 MED ORDER — QUETIAPINE FUMARATE 25 MG PO TABS: 25.0000 mg | ORAL_TABLET | Freq: Every evening | ORAL | Status: DC | PRN

## 2024-08-20 MED ORDER — MAGNESIUM HYDROXIDE 400 MG/5ML PO SUSP: 30.0000 mL | Freq: Every day | ORAL | Status: DC | PRN

## 2024-08-20 MED ORDER — HALOPERIDOL 5 MG PO TABS: 5.0000 mg | ORAL_TABLET | Freq: Three times a day (TID) | ORAL | Status: DC | PRN

## 2024-08-20 NOTE — Progress Notes (Signed)
 Patient has been denied by [facility] due to no appropriate beds available. Patient meets [facility] inpatient criteria per [name], NP. Patient has been faxed out to the following facilities:   [facility]  [address] [place] [place] [address] [phone]  [id number]  [address] [place] [address] [phone]  [facility]  [address] [place] [address] [phone]  [facility]  [address] [place], [place] [address] [phone]  [facility] Patient Placement  [facility], [place] [phone]  [facility]  [address] [place] [address] [phone]  [facility] EFAX  [address] [place], [place] [phone]  [facility]  [address] [place] [address] [phone]  [facility]  [address] [place] [address] [phone]  [facility]  [address] [place], [place] [address] [phone]  [facility]  [address] [place] [address] [phone]  [facility]  [address], [place] [address] [phone]  [facility]  [address] [place] [address] [phone]  [facility]  [address], [place] [address] [phone]  [facility]  [address], [place] [address] [phone]  [facility]  [address] [place] [address] [phone]  [facility] [place]  [place] [phone]  [phone]  [facility]  [address], [place] [address] [phone]   Bunnie Gallop, MSW, LCSW-A  5:10 PM 08/20/2024
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
## 2024-08-20 NOTE — ED Notes (Signed)
 Pt is irritable and argumentative. Denies SI/ HI/AVH. States  I have never heard voices a day in my life She is adamant that she is not going to take any meds. States I do not need any. She states she did nothing to cause an IVC admission. Supportive listening provided. No noted distress. Environmental check complete.

## 2024-08-20 NOTE — Progress Notes (Signed)
 B[facility]/BMU LCSW Progress Note   08/20/2024    5:48 PM  Alicia Barber   [id number]   Type of Contact and Topic:  Psychiatric Bed Placement   Pt accepted to [facility]    Patient meets inpatient criteria per [name], NP  The attending provider will be Dr. Arnold  Call report to [phone]  Rafe Gerold, RN @ [facility] ED notified.     Pt scheduled  to arrive at [facility] for [facility] January 19.   Ayah Cozzolino, MSW, LCSW-A  5:50 PM 08/20/2024
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
## 2024-08-21 DIAGNOSIS — F309 Manic episode, unspecified: Secondary | ICD-10-CM

## 2024-08-21 MED ORDER — IBUPROFEN 600 MG PO TABS
600.0000 mg | ORAL_TABLET | Freq: Four times a day (QID) | ORAL | Status: DC | PRN
  Administered 2024-08-21: 600 mg via ORAL
  Filled 2024-08-21: qty 1

## 2024-08-21 NOTE — Progress Notes (Signed)
 CSW sent requested BH documentation to [facility] in preparation for her Monday admission.    Bunnie Gallop, MSW, LCSW-A  9:53 AM 08/21/2024

## 2024-08-21 NOTE — ED Notes (Signed)
 [name] NP has also asked Dr. Cole to see pt and they are both still seeing other patients.

## 2024-08-21 NOTE — ED Notes (Signed)
 Patient has been asleep all morning.  No distress, Will monitor.

## 2024-08-21 NOTE — ED Notes (Signed)
 Pt observed/assessed in recliner sleeping. RR even and unlabored, appearing in no noted distress. Environmental check complete, will continue to monitor for safety

## 2024-08-21 NOTE — ED Notes (Signed)
 Patient awake now and refused A.m. medication.  Patient irritable and paranoid.  Patient b/p 98/55 and would not allow recheck on opposite arm or manual blood pressure.  She refused breakfast but accepted a snack.  Will monitor.

## 2024-08-21 NOTE — ED Notes (Signed)
Patient resting with no s/s of distress

## 2024-08-21 NOTE — ED Notes (Signed)
 RN spoke with patient A&Ox2 with little conversation.Patient denies intent to harm self/others when asked. Denies A/VH or any physical complaints when asked. Still none compliant with ordered meds. No acute distress noted. Active listening, support and encouragement provided. Routine safety checks conducted according to facility protocol. Encouraged patient to notify staff if thoughts of harm toward self or others arise. Patient verbalize understanding and agreement.
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
## 2024-08-21 NOTE — ED Provider Notes (Signed)
 Behavioral Health Progress Note  Date and Time: 08/21/2024 8:14 PM Name: Alicia Barber MRN:  [id number]  Subjective:  I don't know why I am still IVC  Diagnosis:  Final diagnoses:  Mania (HCC)  Unspecified mood (affective) disorder  Attention deficit hyperactivity disorder (ADHD), unspecified ADHD type   Chart reviewed and discussed with attending psychiatrist, Dr. Kandi Hahn.  Patient is seen face-to-face on the [facility] adult treatment unit during rounds.  On this assessment patient states I do not know why I am still IVC.  Patient was admitted under IVC due to concerns regarding safety, behavior and lack of insight into their condition.  Patient has been irritable and agitated towards staff and this practitioner.  The patient has displayed significant argumentative behavior during interactions with staff, including refusal to follow instructions and frequent verbal challenges.  This practitioner was unable to discuss lab results, admission to [facility], and treatment plan due to patient exhibiting marked irritability, being agitated and voicing frustrations (not having personal hygiene items and being without bra and underwear). Patient is demanding to be transferred off the unit for reassessment despite being informed that at this time there are no assessment rooms available due to the high volume of patients presenting to the facility.  Patient consistently demonstrates poor insight into her current mental health condition.  She continues to deny the necessity of involuntary hospitalization.  She has refused home medications of finasteride  and refused to take Seroquel  that was ordered last night to help with sleep and mood.  The patient interrupts the provider frequently, over talking and not allowing for the communication of information and plan of care.  Provider was unable to discuss the necessary next steps due to the patient's continued verbal interruptions and refusal to  listen to key aspects of care.  The patient has approached the nursing station and behavior has been intrusive and demanding.  Patient has displayed argumentative behavior towards nursing staff and questioning authority.  Despite multiple attempts at redirection the patient has remained resistant and refusing to engage in cooperative communication.  The patient displays poor insight, remains highly irritable, argumentative and resistant to care making it difficult to effectively communicate and manage their treatment.  Patient has requested to speak with person in authority over this provider.  Attending psychiatrist, Dr. Kandi Hahn notified however, due to patient volume and acuity in the facility, attending psychiatrist unable to meet with patient. [facility] has [facility] and may transfer to that facility on tomorrow.  Total Time spent with patient: 10 mins  Past Psychiatric History: ADHD, PTSD Hospitalizations: 1995 was hospitalized in [place], [place] for anorexia; [facility] in [place], [place] (May, 2025) for PTSD; [facility] [place] [facility], F: (Aug, 2025)  Past Medical History:  has a past medical history of Abnormal Pap smear (07/2010), GBS (group B Streptococcus carrier), androgenetic alopecia, vitamin b12 deficiency   Family History: Brother: Asthma, Sarcoma; Maternal aunt: bipolar disorder; three maternal cousins with bipolar disorder.    Social History: Married, currently living apart from husband. Has three sons (17, 37, 41) who are in kinship placement with maternal grandparents. Currently unemployed. 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   Additional Social History:    Pain Medications: see MAR Prescriptions: see MAR Over the Counter: see MAR History of alcohol / drug use?: No history of alcohol / drug abuse    Sleep: Fair  Appetite:  Fair  Current Medications:  Current Facility-Administered Medications   Medication Dose Route Frequency Provider Last Rate Last Admin   acetaminophen  (TYLENOL ) tablet 650 mg  650 mg Oral Q6H PRN Darsi Tien E, NP   650 mg at 08/21/24 1617   alum & mag hydroxide-simeth (MAALOX/MYLANTA) 200-200-20 MG/5ML suspension 30 mL  30 mL Oral Q4H PRN Wallie Lagrand E, NP       haloperidol  (HALDOL ) tablet 5 mg  5 mg Oral TID PRN Makaiyah Schweiger E, NP       And   diphenhydrAMINE  (BENADRYL ) capsule 50 mg  50 mg Oral TID PRN Tracen Mahler E, NP       haloperidol  lactate (HALDOL ) injection 5 mg  5 mg Intramuscular TID PRN Alica Shellhammer E, NP       And   diphenhydrAMINE  (BENADRYL ) injection 50 mg  50 mg Intramuscular TID PRN Jaria Conway E, NP       And   LORazepam  (ATIVAN ) injection 2 mg  2 mg Intramuscular TID PRN Kiante Petrovich E, NP       haloperidol  lactate (HALDOL ) injection 10 mg  10 mg Intramuscular TID PRN Martasia Talamante E, NP       And   diphenhydrAMINE  (BENADRYL ) injection 50 mg  50 mg Intramuscular TID PRN Egan Berkheimer E, NP       And   LORazepam  (ATIVAN ) injection 2 mg  2 mg Intramuscular TID PRN Ashley Montminy E, NP       finasteride  (PROSCAR ) tablet 5 mg  5 mg Oral Daily Marisal Swarey E, NP       ibuprofen  (ADVIL ) tablet 600 mg  600 mg Oral Q6H PRN Bonner Larue E, NP   600 mg at 08/21/24 1706   magnesium  hydroxide (MILK OF MAGNESIA) suspension 30 mL  30 mL Oral Daily PRN Amarra Sawyer E, NP       QUEtiapine  (SEROQUEL ) tablet 25 mg  25 mg Oral QHS PRN Lura Falor E, NP       Current Outpatient Medications  Medication Sig Dispense Refill   fexofenadine (ALLEGRA) 180 MG tablet Take 180 mg by mouth daily as needed.     finasteride  (PROSCAR ) 5 MG tablet Take 5 mg by mouth daily.     ipratropium (ATROVENT) 0.03 % nasal spray Place 1 spray into both nostrils 2 (two) times daily as needed.     methylphenidate 18 MG PO CR tablet Take 18 mg by mouth daily after lunch.     methylphenidate 36 MG PO CR tablet Take 72 mg by mouth every morning.     Multiple Vitamin (MULTIVITAMIN) tablet  Take 1 tablet by mouth daily.     vitamin B-12 (CYANOCOBALAMIN) 500 MCG tablet Take 500 mcg by mouth daily.     Vitamin D , Ergocalciferol , (DRISDOL) 1.25 MG (50000 UNIT) CAPS capsule Take 50,000 Units by mouth once a week. Takes every Tuesday      Labs  Lab Results:  Admission on 08/20/2024  Component Date Value Ref Range Status   WBC 08/20/2024 10.3  4.0 - 10.5 K/uL Final   RBC 08/20/2024 4.26  3.87 - 5.11 MIL/uL Final   Hemoglobin 08/20/2024 13.3  12.0 - 15.0 g/dL Final   HCT 98/82/7973 40.8  36.0 - 46.0 % Final   MCV 08/20/2024 95.8  80.0 - 100.0 fL Final   MCH 08/20/2024 31.2  26.0 - 34.0 pg Final   MCHC 08/20/2024 32.6  30.0 - 36.0 g/dL Final   RDW 98/82/7973 11.5  11.5 - 15.5 % Final  Platelets 08/20/2024 260  150 - 400 K/uL Final   nRBC 08/20/2024 0.0  0.0 - 0.2 % Final   Neutrophils Relative % 08/20/2024 75  % Final   Neutro Abs 08/20/2024 7.7  1.7 - 7.7 K/uL Final   Lymphocytes Relative 08/20/2024 18  % Final   Lymphs Abs 08/20/2024 1.9  0.7 - 4.0 K/uL Final   Monocytes Relative 08/20/2024 5  % Final   Monocytes Absolute 08/20/2024 0.5  0.1 - 1.0 K/uL Final   Eosinophils Relative 08/20/2024 1  % Final   Eosinophils Absolute 08/20/2024 0.1  0.0 - 0.5 K/uL Final   Basophils Relative 08/20/2024 1  % Final   Basophils Absolute 08/20/2024 0.1  0.0 - 0.1 K/uL Final   Immature Granulocytes 08/20/2024 0  % Final   Abs Immature Granulocytes 08/20/2024 0.02  0.00 - 0.07 K/uL Final   Performed at [facility], [address] [place], [place] [address]   Sodium 08/20/2024 140  135 - 145 mmol/L Final   Potassium 08/20/2024 4.2  3.5 - 5.1 mmol/L Final   Chloride 08/20/2024 103  98 - 111 mmol/L Final   CO2 08/20/2024 25  22 - 32 mmol/L Final   Glucose, Bld 08/20/2024 91  70 - 99 mg/dL Final   Glucose reference range applies only to samples taken after fasting for at least 8 hours.   BUN 08/20/2024 7  6 - 20 mg/dL Final   Creatinine, Ser 08/20/2024 0.81  0.44 - 1.00 mg/dL  Final   Calcium 98/82/7973 9.5  8.9 - 10.3 mg/dL Final   Total Protein 98/82/7973 7.3  6.5 - 8.1 g/dL Final   Albumin 98/82/7973 4.5  3.5 - 5.0 g/dL Final   AST 98/82/7973 23  15 - 41 U/L Final   ALT 08/20/2024 16  0 - 44 U/L Final   Alkaline Phosphatase 08/20/2024 72  38 - 126 U/L Final   Total Bilirubin 08/20/2024 0.5  0.0 - 1.2 mg/dL Final   GFR, Estimated 08/20/2024 >60  >60 mL/min Final   Comment: (NOTE) Calculated using the CKD-EPI Creatinine Equation (2021)    Anion gap 08/20/2024 12  5 - 15 Final   Performed at [facility], [address], [place], [place] [address]   Magnesium  08/20/2024 2.2  1.7 - 2.4 mg/dL Final   Performed at [facility], [address] [place], [place] [address]   Alcohol, Ethyl (B) 08/20/2024 <15  <15 mg/dL Final   Comment: (NOTE) For medical purposes only. Performed at [facility], [address] [place], [place] [address]    TSH 08/20/2024 0.679  0.350 - 4.500 uIU/mL Final   Performed at [facility], [address] [place], [place] [address]   Color, Urine 08/20/2024 STRAW (A)  YELLOW Final   APPearance 08/20/2024 CLEAR  CLEAR Final   Specific Gravity, Urine 08/20/2024 1.004 (L)  1.005 - 1.030 Final   pH 08/20/2024 6.0  5.0 - 8.0 Final   Glucose, UA 08/20/2024 NEGATIVE  NEGATIVE mg/dL Final   Hgb urine dipstick 08/20/2024 NEGATIVE  NEGATIVE Final   Bilirubin Urine 08/20/2024 NEGATIVE  NEGATIVE Final   Ketones, ur 08/20/2024 NEGATIVE  NEGATIVE mg/dL Final   Protein, ur 98/82/7973 NEGATIVE  NEGATIVE mg/dL Final   Nitrite 98/82/7973 NEGATIVE  NEGATIVE Final   Leukocytes,Ua 08/20/2024 NEGATIVE  NEGATIVE Final   Performed at [facility], [address], [place], [place] [address]   Preg Test, Ur 08/20/2024  Negative  Negative Final   POC Amphetamine UR 08/20/2024 None Detected  NONE DETECTED (Cut Off Level 1000 ng/mL) Final   POC Secobarbital (BAR) 08/20/2024 None Detected  NONE DETECTED (Cut Off Level 300 ng/mL)  Final   POC Buprenorphine (BUP) 08/20/2024 None Detected  NONE DETECTED (Cut Off Level 10 ng/mL) Final   POC Oxazepam (BZO) 08/20/2024 None Detected  NONE DETECTED (Cut Off Level 300 ng/mL) Final   POC Cocaine UR 08/20/2024 None Detected  NONE DETECTED (Cut Off Level 300 ng/mL) Final   POC Methamphetamine UR 08/20/2024 None Detected  NONE DETECTED (Cut Off Level 1000 ng/mL) Final   POC Morphine 08/20/2024 None Detected  NONE DETECTED (Cut Off Level 300 ng/mL) Final   POC Methadone UR 08/20/2024 None Detected  NONE DETECTED (Cut Off Level 300 ng/mL) Final   POC Oxycodone  UR 08/20/2024 None Detected  NONE DETECTED (Cut Off Level 100 ng/mL) Final   POC Marijuana UR 08/20/2024 None Detected  NONE DETECTED (Cut Off Level 50 ng/mL) Final   Vitamin B-12 08/20/2024 840  180 - 914 pg/mL Final   Performed at [facility], [address] [place], [place] [address]   Vit D, 25-Hydroxy 08/20/2024 73.0  30 - 100 ng/mL Final   Comment: (NOTE) Vitamin D  deficiency has been defined by the Institute of Medicine  and an Endocrine Society practice guideline as a level of serum 25-OH  vitamin D  less than 20 ng/mL (1,2). The Endocrine Society went on to  further define vitamin D  insufficiency as a level between 21 and 29  ng/mL (2).  1. IOM (Institute of Medicine). 2010. Dietary reference intakes for  calcium and D. [place]: [employer]. 2. Holick MF, Binkley , Bischoff-Ferrari HA, et al. Evaluation,  treatment, and prevention of vitamin D  deficiency: an Endocrine  Society clinical practice guideline, JCEM. 2011 Jul; 96(7): 1911-30.  Performed at [facility], [address] [place], [place] [address]   Admission on 07/29/2024, Discharged on 07/29/2024  Component Date Value Ref Range Status   WBC 07/29/2024 7.3  4.0 - 10.5 K/uL Final   RBC 07/29/2024 4.13  3.87 - 5.11 MIL/uL Final   Hemoglobin 07/29/2024 12.9  12.0 - 15.0 g/dL Final   HCT 87/73/7974 38.8  36.0 - 46.0  % Final   MCV 07/29/2024 93.9  80.0 - 100.0 fL Final   MCH 07/29/2024 31.2  26.0 - 34.0 pg Final   MCHC 07/29/2024 33.2  30.0 - 36.0 g/dL Final   RDW 87/73/7974 11.7  11.5 - 15.5 % Final   Platelets 07/29/2024 276  150 - 400 K/uL Final   nRBC 07/29/2024 0.0  0.0 - 0.2 % Final   Neutrophils Relative % 07/29/2024 52  % Final   Neutro Abs 07/29/2024 3.9  1.7 - 7.7 K/uL Final   Lymphocytes Relative 07/29/2024 36  % Final   Lymphs Abs 07/29/2024 2.6  0.7 - 4.0 K/uL Final   Monocytes Relative 07/29/2024 8  % Final   Monocytes Absolute 07/29/2024 0.6  0.1 - 1.0 K/uL Final   Eosinophils Relative 07/29/2024 3  % Final   Eosinophils Absolute 07/29/2024 0.2  0.0 - 0.5 K/uL Final   Basophils Relative 07/29/2024 1  % Final   Basophils Absolute 07/29/2024 0.1  0.0 - 0.1 K/uL Final   Immature Granulocytes 07/29/2024 0  % Final   Abs Immature Granulocytes 07/29/2024 0.01  0.00 - 0.07 K/uL Final   Performed at [facility], [address] [place], [place] [address]   Sodium 07/29/2024 142  135 - 145 mmol/L Final   Potassium 07/29/2024 4.1  3.5 - 5.1 mmol/L Final   Chloride 07/29/2024 105  98 - 111 mmol/L Final   CO2 07/29/2024 25  22 - 32 mmol/L Final   Glucose, Bld 07/29/2024 91  70 - 99 mg/dL Final   Glucose reference range applies only to samples taken after fasting for at least 8 hours.   BUN 07/29/2024 13  6 - 20 mg/dL Final   Creatinine, Ser 07/29/2024 0.87  0.44 - 1.00 mg/dL Final   Calcium 87/73/7974 9.1  8.9 - 10.3 mg/dL Final   Total Protein 87/73/7974 7.0  6.5 - 8.1 g/dL Final   Albumin 87/73/7974 4.6  3.5 - 5.0 g/dL Final   AST 87/73/7974 17  15 - 41 U/L Final   ALT 07/29/2024 16  0 - 44 U/L Final   Alkaline Phosphatase 07/29/2024 70  38 - 126 U/L Final   Total Bilirubin 07/29/2024 0.4  0.0 - 1.2 mg/dL Final   GFR, Estimated 07/29/2024 >60  >60 mL/min Final   Comment: (NOTE) Calculated using the CKD-EPI Creatinine Equation (2021)    Anion gap 07/29/2024 12  5 - 15 Final    Performed at [facility], [address] [place], [place] [address]   Opiates 07/29/2024 NEGATIVE  NEGATIVE Final   Cocaine 07/29/2024 NEGATIVE  NEGATIVE Final   Benzodiazepines 07/29/2024 NEGATIVE  NEGATIVE Final   Amphetamines 07/29/2024 NEGATIVE  NEGATIVE Final   Tetrahydrocannabinol 07/29/2024 NEGATIVE  NEGATIVE Final   Barbiturates 07/29/2024 NEGATIVE  NEGATIVE Final   Methadone Scn, Ur 07/29/2024 NEGATIVE  NEGATIVE Final   Fentanyl  07/29/2024 NEGATIVE  NEGATIVE Final   Comment: (NOTE) Drug screen is for Medical Purposes only. Positive results are preliminary only. If confirmation is needed, notify lab within 5 days.  Drug Class                 Cutoff (ng/mL) Amphetamine and metabolites 1000 Barbiturate and metabolites 200 Benzodiazepine              200 Opiates and metabolites     300 Cocaine and metabolites     300 THC                         50 Fentanyl                     5 Methadone                   300  Trazodone is metabolized in vivo to several metabolites,  including pharmacologically active m-CPP, which is excreted in the  urine.  Immunoassay screens for amphetamines and MDMA have potential  cross-reactivity with these compounds and may provide false positive  result.  Performed at [facility], [address] [place], [place] [address]    Alcohol, Ethyl (B) 07/29/2024 <15  <15 mg/dL Final   Comment: (NOTE) For medical purposes only. Performed at [facility], [address] [place], [place] [address]    Preg Test, Ur 07/29/2024 NEGATIVE  NEGATIVE Final   Comment:        THE SENSITIVITY OF THIS METHODOLOGY IS >20 mIU/mL. Performed at [facility], [address] [place], [place] [address]     Blood Alcohol level:  Lab Results  Component Value Date  ETH <15 08/20/2024   ETH <15 07/29/2024    Metabolic Disorder Labs: No results found for: HGBA1C, MPG No results found for: PROLACTIN No  results found for: CHOL, TRIG, HDL, CHOLHDL, VLDL, LDLCALC  Therapeutic Lab Levels: No results found for: LITHIUM No results found for: VALPROATE No results found for: CBMZ  Physical Findings   Flowsheet Row ED from 08/20/2024 in [facility] ED from 07/29/2024 in [facility] Emergency Department at [facility]  C-SSRS RISK CATEGORY Low Risk No Risk     Musculoskeletal  Strength & Muscle Tone: within normal limits Gait & Station: normal Patient leans: N/A  Psychiatric Specialty Exam  Presentation  General Appearance:  Appropriate for Environment; Fairly Groomed  Eye Contact: Good  Speech: Clear and Coherent; Pressured  Speech Volume: Normal  Handedness:No data recorded  Mood and Affect  Mood: Irritable; Labile  Affect: Congruent   Thought Process  Thought Processes:No data recorded Descriptions of Associations:Tangential  Orientation:Full (Time, Place and Person)  Thought Content:Tangential  Diagnosis of Schizophrenia or Schizoaffective disorder in past: No  Duration of Psychotic Symptoms: Greater than six months   Hallucinations:Hallucinations: None  Ideas of Reference:None  Suicidal Thoughts:Suicidal Thoughts: No  Homicidal Thoughts:Homicidal Thoughts: No   Sensorium  Memory: Immediate Fair; Recent Fair  Judgment: Impaired  Insight: Poor   Executive Functions  Concentration: Fair  Attention Span: Fair  Recall: Fair  Fund of Knowledge: Good  Language: Good   Psychomotor Activity  Psychomotor Activity: Psychomotor Activity: Normal   Assets  Assets: Communication Skills; Desire for Improvement; Housing; Physical Health; Resilience   Sleep  Sleep:No data recorded No Safety Checks orders active in given range  Nutritional Assessment (For OBS and [facility] admissions only) Has the patient had a weight loss or gain of 10 pounds or more in the last 3 months?: No Has the  patient had a decrease in food intake/or appetite?: No Does the patient have dental problems?: No Does the patient have eating habits or behaviors that may be indicators of an eating disorder including binging or inducing vomiting?: No Has the patient recently lost weight without trying?: 0 Has the patient been eating poorly because of a decreased appetite?: 0 Malnutrition Screening Tool Score: 0    Physical Exam  Physical Exam Vitals and nursing note reviewed.  HENT:     Mouth/Throat:     Mouth: Mucous membranes are moist.  Cardiovascular:     Rate and Rhythm: Normal rate.  Pulmonary:     Effort: Pulmonary effort is normal.  Skin:    General: Skin is warm and dry.  Neurological:     Mental Status: She is alert and oriented to person, place, and time.  Psychiatric:     Comments: See HPI    Review of Systems  Constitutional:  Negative for chills and fever.  HENT:  Negative for sinus pain and sore throat.   Respiratory:  Negative for cough and shortness of breath.   Psychiatric/Behavioral:  Negative for hallucinations and suicidal ideas.    Blood pressure (!) 98/55, pulse 81, temperature 98.2 F (36.8 C), temperature source Oral, resp. rate 16, SpO2 98%. There is no height or weight on file to calculate BMI.  Treatment Plan Summary: Daily contact with patient to assess and evaluate symptoms and progress in treatment   Medication management Will continue home medications as below: Finasteride  5 mg PO daily Fexofenadine 180 mg PO daily - held Methylphenidate held to determine if may be a trigger  for mania Seroquel  25 mg PO at bedtime prn sleep/mood  Plan Inpatient hospitalization is recommended.  Patient has been accepted to [facility] and may transfer to that facility on 08/22/2024  [name], PMHNP-BC, FNP-BC  08/21/2024 8:14 PM

## 2024-08-21 NOTE — ED Notes (Signed)
 PT has presented to the window.  Complaining of severe debilitating headache.  She is demanding to see her provider.  She wants Ibuprofen  800 mg and some caffeine  Pt provided with tylenol  650 mg po PRN and offered some coffee. Her provider is not available immediately as she is with another patient.   Charge RN Rafe is unavailable immediately as she is dealing with an emergency.  All of this was explained to pt.   Pt offered patient experience phone number to call and this was not good enough for her.  She wants to speak to someone with authority.  Pt is also questioning her IVC documents/process.  Provider(s) have also been made aware of this.

## 2024-08-21 NOTE — ED Notes (Signed)
 Pt observed/assessed in recliner Pt observed/assessed in room sleeping. RR even and unlabored, appearing in no noted distress. Environmental check complete

## 2024-08-22 NOTE — Progress Notes (Signed)
 Pt given breakfast.  Continues to state  Thank you for feeding a caged animal.  [facility] was called and bed status reconfirmed for [employer].

## 2024-08-22 NOTE — ED Notes (Addendum)
 RN gave report to Sharyn Spencer, RN from [facility]. Patient scheduled to transfer later today. Also left message with Sheriffs office to arrange transportation.

## 2024-08-22 NOTE — Progress Notes (Signed)
 Pt was escorted to [facility] [place].  All belongings from locker 5 were given to the sheriff Dept.  Pt was not allowed to dress per [facility].  She was given a blanket and left without incident.

## 2024-08-22 NOTE — ED Notes (Signed)
 Patient showered and went back to sleep. No s/s of distress

## 2024-08-22 NOTE — Progress Notes (Signed)
 Pt awake and alert this morning.  Pt states  I dont even know why I am here.  Pt was informed that she will be going to [facility] and is still currently under IVC.  Pt given hygiene supplies and was able to clean up and is ready for transport.
